# Patient Record
Sex: Female | Born: 1955 | Race: White | Hispanic: No | Marital: Married | Smoking: Never smoker
Health system: Southern US, Community
[De-identification: ages and names within clinical notes are randomized; demographics above are authoritative.]

## PROBLEM LIST (undated history)

## (undated) DIAGNOSIS — C189 Malignant neoplasm of colon, unspecified: Secondary | ICD-10-CM

## (undated) DIAGNOSIS — K219 Gastro-esophageal reflux disease without esophagitis: Secondary | ICD-10-CM

## (undated) DIAGNOSIS — I10 Essential (primary) hypertension: Secondary | ICD-10-CM

## (undated) DIAGNOSIS — B351 Tinea unguium: Secondary | ICD-10-CM

## (undated) DIAGNOSIS — H532 Diplopia: Secondary | ICD-10-CM

## (undated) DIAGNOSIS — R1312 Dysphagia, oropharyngeal phase: Secondary | ICD-10-CM

## (undated) DIAGNOSIS — E785 Hyperlipidemia, unspecified: Secondary | ICD-10-CM

## (undated) DIAGNOSIS — I639 Cerebral infarction, unspecified: Secondary | ICD-10-CM

## (undated) HISTORY — DX: Essential (primary) hypertension: I10

## (undated) HISTORY — DX: Malignant neoplasm of colon, unspecified: C18.9

## (undated) HISTORY — DX: Diplopia: H53.2

## (undated) HISTORY — DX: Dysphagia, oropharyngeal phase: R13.12

## (undated) HISTORY — DX: Tinea unguium: B35.1

## (undated) HISTORY — PX: PORT A CATH INJECTION (ARMC HX): HXRAD1731

## (undated) HISTORY — PX: COLOSTOMY: SHX63

---

## 2013-09-20 DIAGNOSIS — Z0000 Encounter for general adult medical examination without abnormal findings: Secondary | ICD-10-CM | POA: Insufficient documentation

## 2013-09-20 DIAGNOSIS — K5900 Constipation, unspecified: Secondary | ICD-10-CM | POA: Insufficient documentation

## 2013-09-20 DIAGNOSIS — B351 Tinea unguium: Secondary | ICD-10-CM

## 2013-09-20 DIAGNOSIS — E785 Hyperlipidemia, unspecified: Secondary | ICD-10-CM | POA: Insufficient documentation

## 2013-09-20 DIAGNOSIS — R1312 Dysphagia, oropharyngeal phase: Secondary | ICD-10-CM | POA: Insufficient documentation

## 2013-09-20 DIAGNOSIS — E7439 Other disorders of intestinal carbohydrate absorption: Secondary | ICD-10-CM | POA: Insufficient documentation

## 2013-09-20 DIAGNOSIS — I10 Essential (primary) hypertension: Secondary | ICD-10-CM

## 2013-09-20 DIAGNOSIS — I69959 Hemiplegia and hemiparesis following unspecified cerebrovascular disease affecting unspecified side: Secondary | ICD-10-CM | POA: Insufficient documentation

## 2013-09-20 HISTORY — DX: Dysphagia, oropharyngeal phase: R13.12

## 2013-09-20 HISTORY — DX: Tinea unguium: B35.1

## 2013-09-20 HISTORY — DX: Essential (primary) hypertension: I10

## 2014-07-05 DIAGNOSIS — H532 Diplopia: Secondary | ICD-10-CM

## 2014-07-05 HISTORY — DX: Diplopia: H53.2

## 2016-05-26 DIAGNOSIS — R933 Abnormal findings on diagnostic imaging of other parts of digestive tract: Secondary | ICD-10-CM | POA: Insufficient documentation

## 2016-06-15 DIAGNOSIS — R29818 Other symptoms and signs involving the nervous system: Secondary | ICD-10-CM

## 2016-06-15 DIAGNOSIS — C801 Malignant (primary) neoplasm, unspecified: Secondary | ICD-10-CM | POA: Insufficient documentation

## 2016-06-15 DIAGNOSIS — I69398 Other sequelae of cerebral infarction: Secondary | ICD-10-CM | POA: Insufficient documentation

## 2016-07-17 ENCOUNTER — Emergency Department
Admission: EM | Admit: 2016-07-17 | Discharge: 2016-07-17 | Disposition: A | Discharge status: Home / Self Care | Payer: BLUE CROSS/BLUE SHIELD | Attending: Emergency Medicine | Admitting: Emergency Medicine

## 2016-07-17 ENCOUNTER — Encounter: Payer: Self-pay | Admitting: Emergency Medicine

## 2016-07-17 DIAGNOSIS — Z8673 Personal history of transient ischemic attack (TIA), and cerebral infarction without residual deficits: Secondary | ICD-10-CM | POA: Insufficient documentation

## 2016-07-17 DIAGNOSIS — I10 Essential (primary) hypertension: Secondary | ICD-10-CM | POA: Diagnosis not present

## 2016-07-17 DIAGNOSIS — Z4689 Encounter for fitting and adjustment of other specified devices: Secondary | ICD-10-CM | POA: Insufficient documentation

## 2016-07-17 DIAGNOSIS — Z433 Encounter for attention to colostomy: Secondary | ICD-10-CM | POA: Diagnosis present

## 2016-07-17 HISTORY — DX: Hyperlipidemia, unspecified: E78.5

## 2016-07-17 HISTORY — DX: Gastro-esophageal reflux disease without esophagitis: K21.9

## 2016-07-17 HISTORY — DX: Cerebral infarction, unspecified: I63.9

## 2016-07-17 HISTORY — DX: Essential (primary) hypertension: I10

## 2016-07-17 NOTE — Consult Note (Signed)
[place] Nurse ostomy consult note Stoma type/location: RLQ ileostomy, placed in another facility.  Patient here visiting family.  Is having problems with pouch seal.  Stoma is located in creasing at 3 and 9:00, os is pointed down and flush with skin Stomal assessment/size: 1 ", slightly oval Peristomal assessment: Denuded.  Discussed and demonstrated crusting with powder and skin prep Treatment options for stomal/peristomal skin: Barrier ring flattened to fill in creases at 3 and 9:00.  Barrier ring to promote seal.  Convex barrier used to promote seal to flush stoma Output liquid green effluent Ostomy pouching: 2pc. Convex barrier and pouch.  3 sets provided to patient  Education provided: POuch change performed for patient.  Discussed crusting and need to fill in defects.  Explained rationale for convex system.  Written materials and my contact info is provided to the patient for ongoing assistance while here.  Enrolled patient in [place] program: No  HAs  A DME already  Domenic Moras RN BSN [facility] Pager [phone]

## 2016-07-17 NOTE — ED Notes (Signed)
This RN removed patient's colostomy bag, upon removal of colostomy bag, pt had noted buildup of adhesive on skin around stoma site and skin was noted to be red and irritated. This RN attempted to removed as much adhesive as possible to provide best seal of colostomy bag. This RN instructed patient's husband to stop pulling off colostomy bag due to irritation to skin and to make sure to removal all of the adhesive prior to placing a new bag. Pt's husband states understanding. Pt's husband states understanding of how to place colostomy bag and how to empty at this time.

## 2016-07-17 NOTE — ED Provider Notes (Signed)
[facility] Emergency Department Provider Note       Time seen: ----------------------------------------- 12:47 PM on 07/17/2016 -----------------------------------------     I have reviewed the triage vital signs and the nursing notes.   HISTORY   Chief Complaint Colostomy check    HPI Theresa Brock is a 61 y.o. female who presents to the ED for problems with colostomy bag. Patient drove from [place] to here to see the grandchildren. Patient had have emergency: Surgery when she got here and had a colostomy placed. She has not established care with a doctor here and will be traveling back to [place] soon. Patient has been unable to find help with a colostomy bag and it has been leaking. Patient denies pain or medical complaints   Past Medical History:  Diagnosis Date  . GERD (gastroesophageal reflux disease)   . Hyperlipidemia   . Hypertension   . Stroke [facility])     There are no active problems to display for this patient.   Past Surgical History:  Procedure Laterality Date  . COLOSTOMY      Allergies Patient has no known allergies.  Social History Social History  Substance Use Topics  . Smoking status: Never Smoker  . Smokeless tobacco: Never Used  . Alcohol use No    Review of Systems Constitutional: Negative for fever. Cardiovascular: Negative for chest pain. Respiratory: Negative for shortness of breath. Gastrointestinal: Negative for abdominal pain, vomiting and diarrhea. Skin: Positive for drainage around the colostomy site Neurological: Negative for headaches, focal weakness or numbness.  All systems negative/normal/unremarkable except as stated in the HPI  ____________________________________________   PHYSICAL EXAM:  VITAL SIGNS: ED Triage Vitals  Enc Vitals Group     BP 07/17/16 1123 114/88     Pulse Rate 07/17/16 1123 96     Resp 07/17/16 1123 18     Temp 07/17/16 1123 98.4 F (36.9 C)     Temp Source 07/17/16  1123 Oral     SpO2 07/17/16 1123 98 %     Weight 07/17/16 1124 140 lb (63.5 kg)     Height 07/17/16 1124 5\' 1"  (1.549 m)     Head Circumference --      Peak Flow --      Pain Score --      Pain Loc --      Pain Edu? --      Excl. in [place]? --     Constitutional: Alert and oriented. Well appearing and in no distress. Eyes: Conjunctivae are normal. Normal extraocular movements. Gastrointestinal: Soft and nontender. Normal bowel sounds. Colostomy site appears clean dry and intact with no signs of infection Musculoskeletal: Nontender with normal range of motion in extremities. No lower extremity tenderness nor edema. Neurologic:  Normal speech and language. Left-sided weakness and partial paralysis is noted Skin:  Skin is warm, dry and intact. No rash noted. ____________________________________________  ED COURSE:  Pertinent labs & imaging results that were available during my care of the patient were reviewed by me and considered in my medical decision making (see chart for details). Patient presents for drainage from the colostomy site, we will assess with labs and imaging as indicated.   Procedures ___________________________________________  FINAL ASSESSMENT AND PLAN  Drainage from colostomy  Plan: Patient had presented for drainage from her colostomy and difficulty changing the colostomy bag. We have changed her to a different bag and this appears to be working better. She is stable for discharge at this time.  Earleen Newport, MD   Note: This note was generated in part or whole with voice recognition software. Voice recognition is usually quite accurate but there are transcription errors that can and very often do occur. I apologize for any typographical errors that were not detected and corrected.     Earleen Newport, MD 07/17/16 1250

## 2016-07-17 NOTE — ED Triage Notes (Signed)
Pt comes into the ED via POV c/o colostomy bag leakage.  Patient traveled from [place] to here to see grandchildren.  Had to have emergency colon surgery when she got here and a colostomy was placed.  Patient has not been established with PCP down here due to them traveling back to [place] soon.  Patient unable to find help with her colostomy bag.  Patient has been having leakage around the bag for a couple of weeks.  Patient denies any abdominal pain, N/V.  Patient in NAD at this time with even and unlabored respirations.

## 2017-01-22 ENCOUNTER — Other Ambulatory Visit: Payer: Self-pay | Admitting: *Deleted

## 2017-01-23 ENCOUNTER — Encounter: Payer: Self-pay | Admitting: Oncology

## 2017-01-23 ENCOUNTER — Inpatient Hospital Stay: Payer: BLUE CROSS/BLUE SHIELD | Attending: Oncology | Admitting: Oncology

## 2017-01-23 ENCOUNTER — Telehealth: Payer: Self-pay | Admitting: *Deleted

## 2017-01-23 ENCOUNTER — Other Ambulatory Visit: Payer: Self-pay | Admitting: *Deleted

## 2017-01-23 ENCOUNTER — Other Ambulatory Visit: Payer: Self-pay

## 2017-01-23 ENCOUNTER — Inpatient Hospital Stay: Payer: BLUE CROSS/BLUE SHIELD

## 2017-01-23 ENCOUNTER — Encounter (INDEPENDENT_AMBULATORY_CARE_PROVIDER_SITE_OTHER): Payer: Self-pay

## 2017-01-23 VITALS — BP 140/90 | HR 81 | Temp 98.7°F | Resp 20 | Ht 61.0 in | Wt 140.0 lb

## 2017-01-23 DIAGNOSIS — Z933 Colostomy status: Secondary | ICD-10-CM | POA: Diagnosis not present

## 2017-01-23 DIAGNOSIS — I10 Essential (primary) hypertension: Secondary | ICD-10-CM | POA: Diagnosis not present

## 2017-01-23 DIAGNOSIS — E785 Hyperlipidemia, unspecified: Secondary | ICD-10-CM | POA: Insufficient documentation

## 2017-01-23 DIAGNOSIS — C189 Malignant neoplasm of colon, unspecified: Secondary | ICD-10-CM

## 2017-01-23 DIAGNOSIS — C182 Malignant neoplasm of ascending colon: Secondary | ICD-10-CM | POA: Diagnosis present

## 2017-01-23 DIAGNOSIS — R9389 Abnormal findings on diagnostic imaging of other specified body structures: Secondary | ICD-10-CM | POA: Insufficient documentation

## 2017-01-23 DIAGNOSIS — Z79899 Other long term (current) drug therapy: Secondary | ICD-10-CM | POA: Insufficient documentation

## 2017-01-23 DIAGNOSIS — Z95828 Presence of other vascular implants and grafts: Secondary | ICD-10-CM

## 2017-01-23 DIAGNOSIS — K219 Gastro-esophageal reflux disease without esophagitis: Secondary | ICD-10-CM | POA: Diagnosis not present

## 2017-01-23 DIAGNOSIS — I69359 Hemiplegia and hemiparesis following cerebral infarction affecting unspecified side: Secondary | ICD-10-CM | POA: Diagnosis not present

## 2017-01-23 DIAGNOSIS — Z9221 Personal history of antineoplastic chemotherapy: Secondary | ICD-10-CM

## 2017-01-23 DIAGNOSIS — Z85038 Personal history of other malignant neoplasm of large intestine: Secondary | ICD-10-CM

## 2017-01-23 DIAGNOSIS — R627 Adult failure to thrive: Secondary | ICD-10-CM | POA: Insufficient documentation

## 2017-01-23 DIAGNOSIS — Z08 Encounter for follow-up examination after completed treatment for malignant neoplasm: Secondary | ICD-10-CM | POA: Insufficient documentation

## 2017-01-23 HISTORY — DX: Malignant neoplasm of colon, unspecified: C18.9

## 2017-01-23 LAB — CBC WITH DIFFERENTIAL/PLATELET
BASOS PCT: 1 %
Basophils Absolute: 0.1 10*3/uL (ref 0–0.1)
EOS ABS: 0.1 10*3/uL (ref 0–0.7)
EOS PCT: 1 %
HCT: 34.7 % — ABNORMAL LOW (ref 35.0–47.0)
HEMOGLOBIN: 11.7 g/dL — AB (ref 12.0–16.0)
Lymphocytes Relative: 21 %
Lymphs Abs: 2 10*3/uL (ref 1.0–3.6)
MCH: 32.9 pg (ref 26.0–34.0)
MCHC: 33.6 g/dL (ref 32.0–36.0)
MCV: 97.7 fL (ref 80.0–100.0)
MONO ABS: 0.7 10*3/uL (ref 0.2–0.9)
MONOS PCT: 8 %
NEUTROS PCT: 69 %
Neutro Abs: 6.6 10*3/uL — ABNORMAL HIGH (ref 1.4–6.5)
PLATELETS: 332 10*3/uL (ref 150–440)
RBC: 3.55 MIL/uL — ABNORMAL LOW (ref 3.80–5.20)
RDW: 16.2 % — AB (ref 11.5–14.5)
WBC: 9.5 10*3/uL (ref 3.6–11.0)

## 2017-01-23 LAB — COMPREHENSIVE METABOLIC PANEL
ALK PHOS: 172 U/L — AB (ref 38–126)
ALT: 24 U/L (ref 14–54)
AST: 24 U/L (ref 15–41)
Albumin: 3.7 g/dL (ref 3.5–5.0)
Anion gap: 6 (ref 5–15)
BUN: 15 mg/dL (ref 6–20)
CALCIUM: 9.4 mg/dL (ref 8.9–10.3)
CO2: 25 mmol/L (ref 22–32)
CREATININE: 0.43 mg/dL — AB (ref 0.44–1.00)
Chloride: 105 mmol/L (ref 101–111)
GFR calc non Af Amer: >60 mL/min (ref >60)
Glucose, Bld: 116 mg/dL — ABNORMAL HIGH (ref 65–99)
Potassium: 3.8 mmol/L (ref 3.5–5.1)
SODIUM: 136 mmol/L (ref 135–145)
Total Bilirubin: 0.5 mg/dL (ref 0.3–1.2)
Total Protein: 7.2 g/dL (ref 6.5–8.1)

## 2017-01-23 LAB — IRON AND TIBC
Iron: 57 ug/dL (ref 28–170)
SATURATION RATIOS: 16 % (ref 10.4–31.8)
TIBC: 362 ug/dL (ref 250–450)
UIBC: 305 ug/dL

## 2017-01-23 LAB — VITAMIN B12: VITAMIN B 12: 295 pg/mL (ref 180–914)

## 2017-01-23 LAB — FOLATE: Folate: 32 ng/mL (ref >5.9)

## 2017-01-23 LAB — FERRITIN: Ferritin: 37 ng/mL (ref 11–307)

## 2017-01-23 MED ORDER — HEPARIN SOD (PORK) LOCK FLUSH 100 UNIT/ML IV SOLN
500.0000 [IU] | INTRAVENOUS | Status: AC | PRN
  Administered 2017-01-23: 500 [IU]

## 2017-01-23 MED ORDER — SODIUM CHLORIDE 0.9% FLUSH
10.0000 mL | INTRAVENOUS | Status: AC | PRN
  Administered 2017-01-23: 10 mL
  Filled 2017-01-23: qty 10

## 2017-01-23 NOTE — Progress Notes (Signed)
Hematology/Oncology Consult note [facility] Telephone:([phone] [phone]  Patient Care Team: Patient, No Pcp Per as PCP - General (General Practice)   Name of the patient: Theresa Brock  [id number]  September 07, 1955    Reason for referral- h/o colon cancer   Referring physician- Dr. 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   She had presented with abdominal pain nausea vomiting and intermittent rectal bleeding in April 2018 and was found to have small bowel obstruction at that time.   Patient had a total colectomy on 06/04/2016 pathology showed invasive adenocarcinoma ring-type involving the cecum and the ascending colon.  Tubulovillous adenoma with high-grade dysplasia in the ascending colon.  2 small tubular adenomas in the ascending colon.  One inflammatory polyp in the ascending colon.  Serrated adenoma in the transverse colon.  8 out of 19 pericolonic lymph nodes were positive for tumor.  No loss of nuclear expression of MMR proteins.  Tumor was poorly differentiated and invades through the muscularis propria into pericolonic tissue.  No microscopy perforation.  L VIN PNI were present.  Margins were negative.  pT3 pN2b  Patient had a complicated postoperative course following the colectomy with postoperative ileus C. difficile colitis and was admitted in the ICU for septic shock with pressors.  There was a considerable delay because of that in starting adjuvant chemotherapy.  CBC back in July 2018 was normal with a white count of 10.7, H&H of 12/37 and a platelet count of 445.  LFTs were within normal limits and  serum creatinine was 0.6.  CT chest abdomen and pelvis in July 2018 did not reveal any evidence of recurrent or metastatic disease.  Patient was also seen by GYN for thickened endometrium and underwent transvaginal ultrasound in August 2018 which showed a 2.8 cm fibroid as well as endometrial thickening of 12.8 mm.  Plan was for hysteroscopy and D&C in September 2018  Adjuvant chemotherapy with FOLFOX was planned every 2 weeks for 12 cycles which started in August 2018.  Adjuvant chemotherapy was initiated on 09/30/2016 and from review of records so far I see that her last chemotherapy was cycle #5 that was given on 11/27/2016 after that chemo patient was admitted to the hospital on 12/07/2016 with renal failure and failure to thrive.  Patient did not desire further chemo at that point  Patient has [place] from [place] to be with her daughter and son-in-law but is not currently sure how long she will stay here.  She has lived in an [place] for the last 20 years.  Currently patient feels back to her baseline after she stopped her chemotherapy in October 2018.  She does not report any peripheral neuropathy from chemotherapy.  Reports that her appetite is good and she is slowly gaining weight.  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 Negative for chills, fever and weight loss.  HENT: Negative for congestion, ear discharge and nosebleeds.   Eyes: Negative for blurred  vision.  Respiratory: Negative for cough, hemoptysis, sputum production, shortness of breath and wheezing.   Cardiovascular: Negative for chest pain, palpitations, orthopnea and claudication.  Gastrointestinal: Negative for abdominal pain, blood in stool, constipation, diarrhea, heartburn, melena, nausea and vomiting.  Genitourinary:  Negative for dysuria, flank pain, frequency, hematuria and urgency.  Musculoskeletal: Negative for back pain, joint pain and myalgias.  Skin: Negative for rash.  Neurological: Positive for focal weakness. Negative for dizziness, tingling, seizures, weakness and headaches.  Endo/Heme/Allergies: Does not bruise/bleed easily.  Psychiatric/Behavioral: Negative for depression and suicidal ideas. The patient does not have insomnia.     No Known Allergies  There are no active problems to display for this patient.    Past Medical History:  Diagnosis Date  . Colon cancer ([place])   . GERD (gastroesophageal reflux disease)   . Hyperlipidemia   . Hypertension   . Stroke [facility])      Past Surgical History:  Procedure Laterality Date  . COLOSTOMY      Social History   Socioeconomic History  . Marital status: Married    Spouse name: Not on file  . Number of children: Not on file  . Years of education: Not on file  . Highest education level: Not on file  Social Needs  . Financial resource strain: Not on file  . Food insecurity - worry: Not on file  . Food insecurity - inability: Not on file  . Transportation needs - medical: Not on file  . Transportation needs - non-medical: Not on file  Occupational History  . Not on file  Tobacco Use  . Smoking status: Never Smoker  . Smokeless tobacco: Never Used  Substance and Sexual Activity  . Alcohol use: No  . Drug use: No  . Sexual activity: Not on file  Other Topics Concern  . Not on file  Social History Narrative  . Not on file     No family history on file.   Current Outpatient Medications:  .  atorvastatin (LIPITOR) 20 MG tablet, , Disp: , Rfl: 0 .  losartan (COZAAR) 25 MG tablet, , Disp: , Rfl: 0 .  Multiple Vitamin (MULTIVITAMIN) tablet, Take 1 tablet by mouth daily., Disp: , Rfl:  .  Potassium 99 MG TABS, Take by mouth., Disp: , Rfl:    Physical exam:  Vitals:   01/23/17 0935 01/23/17 0944  BP:  140/90  Pulse:  81    Resp:  20  Temp:  98.7 F (37.1 C)  TempSrc:  Tympanic  Weight: 140 lb (63.5 kg)   Height: 5' 1"  (1.549 m)    Physical Exam  Constitutional: She is oriented to person, place, and time.  Obese female sitting in a wheelchair.  Appears in no acute distress.  HENT:  Head: Normocephalic and atraumatic.  Eyes: EOM are normal. Pupils are equal, round, and reactive to light.  Neck: Normal range of motion.  Cardiovascular: Normal rate, regular rhythm and normal heart sounds.  Pulmonary/Chest: Effort normal and breath sounds normal.  Chest wall port in place  Abdominal: Soft. Bowel sounds are normal.  Ileostomy in place  Neurological: She is alert and oriented to person, place, and time.  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  Right facial droop.  Skin: Skin is warm and dry.      Assessment and plan- Patient is a 61 y.o. female with a history of stage III colon cancer pT3 pN2 cM0 status post total colectomy and 5 cycles of adjuvant chemotherapy  with FOLFOX ending in October 2018.  Patient did not desire further chemotherapy due to poor tolerance and comorbidities  Today I will check a CBC with differential, CMP, CEA, ferritin and iron studies as well as B12 and folate.  Patient's last CT scan was in April 2018 and I will obtain repeat CT chest abdomen and pelvis (with or without contrast depending on creatinine).  I will call her with the results of the CT scan but if there are any concerning findings I will see her sooner  I will see her back in 3 months time with blood work  She will need port flushes every 8 weeks  Cancer Staging Malignant neoplasm of colon [facility]) Staging form: Colon and Rectum, AJCC 8th Edition - Pathologic stage from 01/23/2017: Stage IIIC (pT3, pN2b, cM0) - Signed by Sindy Guadeloupe, MD on 01/23/2017   Greater than 30 minutes of time spent in reviewing outside records.   Thank you for this kind referral and the opportunity to participate in the care of  this patient   Visit Diagnosis 1. Malignant neoplasm of colon, unspecified part of colon ([place])   2. Encounter for follow-up surveillance of colon cancer     Dr. Randa Evens, MD, MPH [facility] at [facility] Pager- [phone] 01/23/2017  10:10 AM

## 2017-01-23 NOTE — Progress Notes (Deleted)
     Hematology/Oncology Consult note [facility]  Telephone:([phone] [phone]  Patient Care Team: Patient, No Pcp Per as PCP - General (General Practice)   Name of the patient: Theresa Brock  [id number]  1955/12/10   Date of visit: 01/23/17  Diagnosis-stage IIIc colon cancer  Chief complaint/ Reason for visit- ***  Heme/Onc history: ***  Interval history- ***  ECOG PS- *** Pain scale- *** Opioid associated constipation- ***  Review of systems- Review of Systems  Constitutional: Negative for chills, fever, malaise/fatigue and weight loss.  HENT: Negative for congestion, ear discharge and nosebleeds.   Eyes: Negative for blurred vision.  Respiratory: Negative for cough, hemoptysis, sputum production, shortness of breath and wheezing.   Cardiovascular: Negative for chest pain, palpitations, orthopnea and claudication.  Gastrointestinal: Negative for abdominal pain, blood in stool, constipation, diarrhea, heartburn, melena, nausea and vomiting.  Genitourinary: Negative for dysuria, flank pain, frequency, hematuria and urgency.  Musculoskeletal: Negative for back pain, joint pain and myalgias.  Skin: Negative for rash.  Neurological: Negative for dizziness, tingling, focal weakness, seizures, weakness and headaches.  Endo/Heme/Allergies: Does not bruise/bleed easily.  Psychiatric/Behavioral: Negative for depression and suicidal ideas. The patient does not have insomnia.       No Known Allergies   Past Medical History:  Diagnosis Date  . GERD (gastroesophageal reflux disease)   . Hyperlipidemia   . Hypertension   . Stroke [facility])      Past Surgical History:  Procedure Laterality Date  . COLOSTOMY      Social History   Socioeconomic History  . Marital status: Married    Spouse name: Not on file  . Number of children: Not on file  . Years of education: Not on file  . Highest education level: Not on file  Social Needs  .  Financial resource strain: Not on file  . Food insecurity - worry: Not on file  . Food insecurity - inability: Not on file  . Transportation needs - medical: Not on file  . Transportation needs - non-medical: Not on file  Occupational History  . Not on file  Tobacco Use  . Smoking status: Never Smoker  . Smokeless tobacco: Never Used  Substance and Sexual Activity  . Alcohol use: No  . Drug use: No  . Sexual activity: Not on file  Other Topics Concern  . Not on file  Social History Narrative  . Not on file    No family history on file.  No current outpatient medications on file.  Physical exam: There were no vitals filed for this visit. Physical Exam  Constitutional: She is oriented to person, place, and time and well-developed, well-nourished, and in no distress.  HENT:  Head: Normocephalic and atraumatic.  Eyes: EOM are normal. Pupils are equal, round, and reactive to light.  Neck: Normal range of motion.  Cardiovascular: Normal rate, regular rhythm and normal heart sounds.  Pulmonary/Chest: Effort normal and breath sounds normal.  Abdominal: Soft. Bowel sounds are normal.  Neurological: She is alert and oriented to person, place, and time.  Skin: Skin is warm and dry.       Assessment and plan- Patient is a 61 y.o. female ***   Visit Diagnosis No diagnosis found.   Dr. Randa Evens, MD, MPH [place] at [facility] Pager- [phone] 01/23/2017 9:17 AM

## 2017-01-23 NOTE — Telephone Encounter (Signed)
Called pt and left message on phone that her kidney function was good and so therefore Dr. Janese Banks will order the CT scan and the scheduler here will call her with date and times and if she has any questions she can call me and left my number.

## 2017-01-24 LAB — CEA: CEA: 1.4 ng/mL (ref 0.0–4.7)

## 2017-03-18 ENCOUNTER — Telehealth: Payer: Self-pay | Admitting: *Deleted

## 2017-03-18 ENCOUNTER — Other Ambulatory Visit: Payer: Self-pay | Admitting: *Deleted

## 2017-03-18 DIAGNOSIS — C189 Malignant neoplasm of colon, unspecified: Secondary | ICD-10-CM

## 2017-03-18 NOTE — Telephone Encounter (Signed)
Called and got in touch with husband and pt has been scheduled for ct scan for 2/22 and arrive at [facility] radiology on [address] at 1:15. The scan is 1:30.  NPO for 4 hours prior to scan.  Patient has port flush on 2/21 and we will draw a lba on her to make sure her kidney function is ok.  Also when they get the port flush then they will need to go to [address] and pick up prep kit. Husband called out the address to the outpt [address] [facility] and the dates and times for scan and instructions and he will call me back if he has issues

## 2017-03-23 ENCOUNTER — Other Ambulatory Visit: Payer: Self-pay | Admitting: Oncology

## 2017-03-23 ENCOUNTER — Other Ambulatory Visit: Payer: Self-pay | Admitting: *Deleted

## 2017-03-26 ENCOUNTER — Inpatient Hospital Stay: Payer: BLUE CROSS/BLUE SHIELD | Attending: Oncology

## 2017-03-26 ENCOUNTER — Telehealth: Payer: Self-pay | Admitting: *Deleted

## 2017-03-26 DIAGNOSIS — K219 Gastro-esophageal reflux disease without esophagitis: Secondary | ICD-10-CM | POA: Diagnosis not present

## 2017-03-26 DIAGNOSIS — Z79899 Other long term (current) drug therapy: Secondary | ICD-10-CM | POA: Diagnosis not present

## 2017-03-26 DIAGNOSIS — Z933 Colostomy status: Secondary | ICD-10-CM | POA: Insufficient documentation

## 2017-03-26 DIAGNOSIS — Z9221 Personal history of antineoplastic chemotherapy: Secondary | ICD-10-CM | POA: Diagnosis not present

## 2017-03-26 DIAGNOSIS — I69359 Hemiplegia and hemiparesis following cerebral infarction affecting unspecified side: Secondary | ICD-10-CM | POA: Insufficient documentation

## 2017-03-26 DIAGNOSIS — C189 Malignant neoplasm of colon, unspecified: Secondary | ICD-10-CM

## 2017-03-26 DIAGNOSIS — C182 Malignant neoplasm of ascending colon: Secondary | ICD-10-CM | POA: Diagnosis present

## 2017-03-26 DIAGNOSIS — I10 Essential (primary) hypertension: Secondary | ICD-10-CM | POA: Insufficient documentation

## 2017-03-26 DIAGNOSIS — E785 Hyperlipidemia, unspecified: Secondary | ICD-10-CM | POA: Diagnosis not present

## 2017-03-26 DIAGNOSIS — R9389 Abnormal findings on diagnostic imaging of other specified body structures: Secondary | ICD-10-CM | POA: Insufficient documentation

## 2017-03-26 DIAGNOSIS — R627 Adult failure to thrive: Secondary | ICD-10-CM | POA: Insufficient documentation

## 2017-03-26 DIAGNOSIS — Z95828 Presence of other vascular implants and grafts: Secondary | ICD-10-CM

## 2017-03-26 LAB — BASIC METABOLIC PANEL
Anion gap: 5 (ref 5–15)
BUN: 18 mg/dL (ref 6–20)
CALCIUM: 9.5 mg/dL (ref 8.9–10.3)
CO2: 27 mmol/L (ref 22–32)
CREATININE: 0.58 mg/dL (ref 0.44–1.00)
Chloride: 106 mmol/L (ref 101–111)
GFR calc Af Amer: >60 mL/min (ref >60)
GFR calc non Af Amer: >60 mL/min (ref >60)
GLUCOSE: 117 mg/dL — AB (ref 65–99)
Potassium: 4 mmol/L (ref 3.5–5.1)
Sodium: 138 mmol/L (ref 135–145)

## 2017-03-26 MED ORDER — SODIUM CHLORIDE 0.9% FLUSH
10.0000 mL | INTRAVENOUS | Status: AC | PRN
  Administered 2017-03-26: 10 mL
  Filled 2017-03-26: qty 10

## 2017-03-26 MED ORDER — HEPARIN SOD (PORK) LOCK FLUSH 100 UNIT/ML IV SOLN
500.0000 [IU] | INTRAVENOUS | Status: AC | PRN
  Administered 2017-03-26: 500 [IU]

## 2017-03-26 NOTE — Telephone Encounter (Signed)
Called and left message with husband that the dept. That gets authorization said that her insurance did not require PA.  Then when it went to next level there was another number they needed to call and when they did it required prior auth. thye have sent in the info but not got authorization.  The ct scan has been changed to 3/6. Husband called me back and understands and I told him that when I see that it has been approved I will call him back. He is agreeable to the plan

## 2017-03-27 ENCOUNTER — Ambulatory Visit: Admission: RE | Admit: 2017-03-27 | Payer: BLUE CROSS/BLUE SHIELD | Source: Ambulatory Visit

## 2017-03-30 ENCOUNTER — Telehealth: Payer: Self-pay | Admitting: *Deleted

## 2017-03-30 NOTE — Telephone Encounter (Signed)
Called patient to inform her that her CT Scan was approved by insurance. Gave message to patient's husband.      dhs

## 2017-04-08 ENCOUNTER — Ambulatory Visit
Admission: RE | Admit: 2017-04-08 | Discharge: 2017-04-08 | Disposition: A | Discharge status: Home / Self Care | Payer: BLUE CROSS/BLUE SHIELD | Source: Ambulatory Visit | Attending: Oncology | Admitting: Oncology

## 2017-04-08 DIAGNOSIS — Z85038 Personal history of other malignant neoplasm of large intestine: Secondary | ICD-10-CM | POA: Insufficient documentation

## 2017-04-08 DIAGNOSIS — C189 Malignant neoplasm of colon, unspecified: Secondary | ICD-10-CM

## 2017-04-08 DIAGNOSIS — D259 Leiomyoma of uterus, unspecified: Secondary | ICD-10-CM | POA: Diagnosis not present

## 2017-04-08 DIAGNOSIS — K8020 Calculus of gallbladder without cholecystitis without obstruction: Secondary | ICD-10-CM | POA: Insufficient documentation

## 2017-04-08 DIAGNOSIS — K760 Fatty (change of) liver, not elsewhere classified: Secondary | ICD-10-CM | POA: Diagnosis not present

## 2017-04-08 MED ORDER — IOPAMIDOL (ISOVUE-300) INJECTION 61%
100.0000 mL | Freq: Once | INTRAVENOUS | Status: AC | PRN
  Administered 2017-04-08: 100 mL via INTRAVENOUS

## 2017-04-21 ENCOUNTER — Telehealth: Payer: Self-pay | Admitting: *Deleted

## 2017-04-21 NOTE — Telephone Encounter (Signed)
Husband called to get results of ct scan. Called him back and left message that the scan did not show any recurrent disease. The patient has appt 3/21

## 2017-04-23 ENCOUNTER — Encounter: Payer: Self-pay | Admitting: Oncology

## 2017-04-23 ENCOUNTER — Telehealth: Payer: Self-pay

## 2017-04-23 ENCOUNTER — Inpatient Hospital Stay (HOSPITAL_BASED_OUTPATIENT_CLINIC_OR_DEPARTMENT_OTHER): Payer: BLUE CROSS/BLUE SHIELD | Admitting: Oncology

## 2017-04-23 ENCOUNTER — Inpatient Hospital Stay: Payer: BLUE CROSS/BLUE SHIELD | Attending: Oncology

## 2017-04-23 VITALS — BP 128/96 | HR 93 | Temp 98.9°F | Resp 18 | Ht 61.0 in | Wt 146.8 lb

## 2017-04-23 DIAGNOSIS — Z79899 Other long term (current) drug therapy: Secondary | ICD-10-CM | POA: Diagnosis not present

## 2017-04-23 DIAGNOSIS — C189 Malignant neoplasm of colon, unspecified: Secondary | ICD-10-CM

## 2017-04-23 DIAGNOSIS — Z9221 Personal history of antineoplastic chemotherapy: Secondary | ICD-10-CM

## 2017-04-23 DIAGNOSIS — Z85038 Personal history of other malignant neoplasm of large intestine: Secondary | ICD-10-CM | POA: Insufficient documentation

## 2017-04-23 DIAGNOSIS — E785 Hyperlipidemia, unspecified: Secondary | ICD-10-CM | POA: Diagnosis not present

## 2017-04-23 DIAGNOSIS — K760 Fatty (change of) liver, not elsewhere classified: Secondary | ICD-10-CM | POA: Insufficient documentation

## 2017-04-23 DIAGNOSIS — K219 Gastro-esophageal reflux disease without esophagitis: Secondary | ICD-10-CM | POA: Diagnosis not present

## 2017-04-23 DIAGNOSIS — Z08 Encounter for follow-up examination after completed treatment for malignant neoplasm: Secondary | ICD-10-CM

## 2017-04-23 DIAGNOSIS — K8020 Calculus of gallbladder without cholecystitis without obstruction: Secondary | ICD-10-CM | POA: Insufficient documentation

## 2017-04-23 DIAGNOSIS — Z8673 Personal history of transient ischemic attack (TIA), and cerebral infarction without residual deficits: Secondary | ICD-10-CM | POA: Diagnosis not present

## 2017-04-23 DIAGNOSIS — I10 Essential (primary) hypertension: Secondary | ICD-10-CM | POA: Insufficient documentation

## 2017-04-23 LAB — COMPREHENSIVE METABOLIC PANEL
ALBUMIN: 4 g/dL (ref 3.5–5.0)
ALK PHOS: 147 U/L — AB (ref 38–126)
ALT: 34 U/L (ref 14–54)
AST: 22 U/L (ref 15–41)
Anion gap: 9 (ref 5–15)
BILIRUBIN TOTAL: 0.3 mg/dL (ref 0.3–1.2)
BUN: 21 mg/dL — ABNORMAL HIGH (ref 6–20)
CALCIUM: 9.4 mg/dL (ref 8.9–10.3)
CO2: 23 mmol/L (ref 22–32)
Chloride: 105 mmol/L (ref 101–111)
Creatinine, Ser: 0.67 mg/dL (ref 0.44–1.00)
GFR calc Af Amer: >60 mL/min (ref >60)
GFR calc non Af Amer: >60 mL/min (ref >60)
Glucose, Bld: 133 mg/dL — ABNORMAL HIGH (ref 65–99)
Potassium: 4 mmol/L (ref 3.5–5.1)
Sodium: 137 mmol/L (ref 135–145)
TOTAL PROTEIN: 7.6 g/dL (ref 6.5–8.1)

## 2017-04-23 LAB — CBC WITH DIFFERENTIAL/PLATELET
BASOS ABS: 0.1 10*3/uL (ref 0–0.1)
BASOS PCT: 1 %
EOS PCT: 1 %
Eosinophils Absolute: 0.1 10*3/uL (ref 0–0.7)
HEMATOCRIT: 38.6 % (ref 35.0–47.0)
Hemoglobin: 13.3 g/dL (ref 12.0–16.0)
Lymphocytes Relative: 22 %
Lymphs Abs: 2 10*3/uL (ref 1.0–3.6)
MCH: 33 pg (ref 26.0–34.0)
MCHC: 34.5 g/dL (ref 32.0–36.0)
MCV: 95.6 fL (ref 80.0–100.0)
MONO ABS: 0.6 10*3/uL (ref 0.2–0.9)
Monocytes Relative: 7 %
NEUTROS ABS: 6.1 10*3/uL (ref 1.4–6.5)
Neutrophils Relative %: 69 %
PLATELETS: 327 10*3/uL (ref 150–440)
RBC: 4.03 MIL/uL (ref 3.80–5.20)
RDW: 13.1 % (ref 11.5–14.5)
WBC: 8.8 10*3/uL (ref 3.6–11.0)

## 2017-04-23 NOTE — Progress Notes (Signed)
No new changes noted today 

## 2017-04-23 NOTE — Telephone Encounter (Signed)
Spoke with patient husband about appointment schedule for general surgery for Ileostomy reversal and port removal ( appointment time 04/27/17 at 9:30AM) The patient husband was agreeable and understanding.

## 2017-04-24 LAB — CEA: CEA1: 1.3 ng/mL (ref 0.0–4.7)

## 2017-04-24 NOTE — Progress Notes (Signed)
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   She had presented with abdominal pain nausea vomiting and intermittent rectal bleeding in April 2018 and was found to have small bowel obstruction at that time.   Patient had a total colectomy on 06/04/2016 pathology showed invasive adenocarcinoma ring-type involving the cecum and the ascending colon.  Tubulovillous adenoma with high-grade dysplasia in the ascending colon.  2 small tubular adenomas in the ascending colon.  One inflammatory polyp in the ascending colon.  Serrated adenoma in the transverse colon.  8 out of 19 pericolonic lymph nodes were positive for tumor.  No loss of nuclear expression of MMR proteins.  Tumor was poorly differentiated and invades through the muscularis propria into pericolonic tissue.  No microscopy perforation.  L VIN PNI were present.  Margins were negative.  pT3 pN2b  Patient had a complicated postoperative course following the colectomy with postoperative ileus C. difficile colitis and was admitted in the ICU for septic shock with pressors.  There was a considerable delay because of that in starting adjuvant chemotherapy.  CBC  back in July 2018 was normal with a white count of 10.7, H&H of 12/37 and a platelet count of 445.  LFTs were within normal limits and serum creatinine was 0.6.  CT chest abdomen and pelvis in July 2018 did not reveal any evidence of recurrent or metastatic disease.  Patient was also seen by GYN for thickened endometrium and underwent transvaginal ultrasound in August 2018 which showed a 2.8 cm fibroid as well as endometrial thickening of 12.8 mm.  Plan was for hysteroscopy and D&C in September 2018  Adjuvant chemotherapy with FOLFOX was planned every 2 weeks for 12 cycles which started in August 2018.  Adjuvant chemotherapy was initiated on 09/30/2016 and from review of records so far I see that her last chemotherapy was cycle #5 that was given on 11/27/2016 after that chemo patient was admitted to the hospital on 12/07/2016 with renal failure and failure to thrive.  Patient did not desire further chemo at that point  Patient has [place] from [place] to be with her daughter and son-in-law but is not currently sure how long she will stay here.  She has lived in an [place] for the last 20 years.  Currently patient feels back to her baseline after she stopped her chemotherapy in October 2018.  She does not report any peripheral neuropathy from chemotherapy.  Reports that her appetite is good and she is slowly gaining weight.  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    Interval history- overall she is doing well and feels at her baseline. 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  HENT: Negative for congestion, ear discharge and nosebleeds.   Eyes: Negative for blurred vision.  Respiratory: Negative for cough, hemoptysis, sputum production, shortness of breath and wheezing.   Cardiovascular: Negative for chest  pain, palpitations, orthopnea and claudication.  Gastrointestinal: Negative for abdominal pain, blood in stool, constipation, diarrhea, heartburn, melena, nausea and vomiting.  Genitourinary: Negative for dysuria, flank pain, frequency, hematuria and urgency.  Musculoskeletal: Negative for back pain, joint pain and myalgias.  Skin: Negative for rash.  Neurological: Negative for dizziness, tingling, focal weakness, seizures, weakness and headaches.       Left sided hemiplegia  Endo/Heme/Allergies: Does not bruise/bleed easily.  Psychiatric/Behavioral: Negative for depression and suicidal ideas. The patient does not have insomnia.      No Known Allergies   Past Medical History:  Diagnosis Date  . Colon cancer ([place])   . Diplopia 07/05/2014   Overview:  Converted from Centricity: Description - DIPLOPIA  . Dysphagia, oropharyngeal phase 09/20/2013   Overview:  Converted from Centricity: Description - DYSPHAGIA, OROPHARYNGEAL PHASE  . Essential (primary) hypertension 09/20/2013   Overview:  Converted from Centricity: Description - HYPERTENSION, BENIGN ESSENTIAL  . GERD (gastroesophageal reflux disease)   . Hyperlipidemia   . Hypertension   . Malignant neoplasm of colon ([place]) 01/23/2017  . Stroke ([place])   . Tinea unguium 09/20/2013   Overview:  Converted from Centricity: Description - ONYCHOMYCOSIS, TOENAILS     Past Surgical History:  Procedure Laterality Date  . COLOSTOMY      Social History   Socioeconomic History  . Marital status: Married    Spouse name: Not on file  . Number of children: Not on file  . Years of education: Not on file  . Highest education level: Not on file  Occupational History  . Not on file  Social Needs  . Financial resource strain: Not on file  . Food insecurity:    Worry: Not on file    Inability: Not on file  . Transportation needs:    Medical: Not on file    Non-medical: Not on file  Tobacco Use  . Smoking status: Never Smoker  . Smokeless  tobacco: Never Used  Substance and Sexual Activity  . Alcohol use: No  . Drug use: No  . Sexual activity: Not on file  Lifestyle  . Physical activity:    Days per week: Not on file    Minutes per session: Not on file  . Stress: Not on file  Relationships  . Social connections:    Talks on phone: Not on file    Gets together: Not on file    Attends religious service: Not on file    Active member of club or organization: Not on file    Attends meetings of clubs or organizations: Not on file    Relationship status: Not on file  . Intimate partner violence:    Fear of current or ex partner: Not on file    Emotionally abused: Not on file    Physically abused: Not on file    Forced sexual activity: Not on file  Other Topics Concern  . Not on file  Social History Narrative  . Not on file    Family History  Problem Relation Age of Onset  . Cancer Brother      Current Outpatient Medications:  .  atorvastatin (LIPITOR) 20 MG  tablet, , Disp: , Rfl: 0 .  losartan (COZAAR) 25 MG tablet, , Disp: , Rfl: 0 .  Multiple Vitamin (MULTIVITAMIN) tablet, Take 1 tablet by mouth daily., Disp: , Rfl:  .  Potassium 99 MG TABS, Take by mouth., Disp: , Rfl:   Physical exam:  Vitals:   04/23/17 1115  BP: (!) 128/96  Pulse: 93  Resp: 18  Temp: 98.9 F (37.2 C)  TempSrc: Tympanic  SpO2: 97%  Weight: 146 lb 12.8 oz (66.6 kg)  Height: 5' 1"  (1.549 m)   Physical Exam  Constitutional: She is oriented to person, place, and time.  Mildly obese. Sitting in a wheelchair. Appears in no acute distress  HENT:  Head: Normocephalic and atraumatic.  Eyes: Pupils are equal, round, and reactive to light. EOM are normal.  Neck: Normal range of motion.  Cardiovascular: Normal rate, regular rhythm and normal heart sounds.  Pulmonary/Chest: Effort normal and breath sounds normal.  Abdominal: Soft. Bowel sounds are normal.  Ileostomy in place.   Neurological: She is alert and oriented to person, place,  and time.  Baseline left hemiplegia from prior stroke  Skin: Skin is warm and dry.     CMP Latest Ref Rng & Units 04/23/2017  Glucose 65 - 99 mg/dL 133(H)  BUN 6 - 20 mg/dL 21(H)  Creatinine 0.44 - 1.00 mg/dL 0.67  Sodium 135 - 145 mmol/L 137  Potassium 3.5 - 5.1 mmol/L 4.0  Chloride 101 - 111 mmol/L 105  CO2 22 - 32 mmol/L 23  Calcium 8.9 - 10.3 mg/dL 9.4  Total Protein 6.5 - 8.1 g/dL 7.6  Total Bilirubin 0.3 - 1.2 mg/dL 0.3  Alkaline Phos 38 - 126 U/L 147(H)  AST 15 - 41 U/L 22  ALT 14 - 54 U/L 34   CBC Latest Ref Rng & Units 04/23/2017  WBC 3.6 - 11.0 K/uL 8.8  Hemoglobin 12.0 - 16.0 g/dL 13.3  Hematocrit 35.0 - 47.0 % 38.6  Platelets 150 - 440 K/uL 327    No images are attached to the encounter.  Ct Chest W Contrast  Result Date: 04/09/2017 CLINICAL DATA:  Followup stage III colon carcinoma. Recently completed chemotherapy. Restaging. EXAM: CT CHEST, ABDOMEN, AND PELVIS WITH CONTRAST TECHNIQUE: Multidetector CT imaging of the chest, abdomen and pelvis was performed following the standard protocol during bolus administration of intravenous contrast. CONTRAST:  148m ISOVUE-300 IOPAMIDOL (ISOVUE-300) INJECTION 61% COMPARISON:  None. FINDINGS: CT CHEST FINDINGS Cardiovascular: No acute findings. Mediastinum/Lymph Nodes: No masses or pathologically enlarged lymph nodes identified. Tiny sub-cm bilateral thyroid lobe nodules noted. Lungs/Pleura: No pulmonary infiltrate or mass identified. No effusion present. Musculoskeletal:  No suspicious bone lesions identified. CT ABDOMEN AND PELVIS FINDINGS Hepatobiliary: No masses identified. Mild hepatic steatosis. A few tiny calcified gallstones are seen, however there is no evidence of cholecystitis or biliary ductal dilatation. Pancreas:  No mass or inflammatory changes. Spleen:  Within normal limits in size and appearance. Adrenals/Urinary tract:  No masses or hydronephrosis. Stomach/Bowel: Postop changes from near total colectomy. No evidence of  obstruction, inflammatory process, or abnormal fluid collections. Vascular/Lymphatic: No pathologically enlarged lymph nodes identified. No abdominal aortic aneurysm. Reproductive: 1.5 cm posterior uterine fibroid. Adnexal regions are unremarkable. Other: No evidence of peritoneal thickening, nodularity, or ascites. Musculoskeletal:  No suspicious bone lesions identified. IMPRESSION: No evidence of recurrent or metastatic carcinoma within the chest, abdomen, or pelvis. No other acute findings. Cholelithiasis.  No radiographic evidence of cholecystitis. Hepatic steatosis. Small posterior uterine fibroid. Electronically Signed  By: Earle Gell M.D.   On: 04/09/2017 08:19   Ct Abdomen Pelvis W Contrast  Result Date: 04/09/2017 CLINICAL DATA:  Followup stage III colon carcinoma. Recently completed chemotherapy. Restaging. EXAM: CT CHEST, ABDOMEN, AND PELVIS WITH CONTRAST TECHNIQUE: Multidetector CT imaging of the chest, abdomen and pelvis was performed following the standard protocol during bolus administration of intravenous contrast. CONTRAST:  157m ISOVUE-300 IOPAMIDOL (ISOVUE-300) INJECTION 61% COMPARISON:  None. FINDINGS: CT CHEST FINDINGS Cardiovascular: No acute findings. Mediastinum/Lymph Nodes: No masses or pathologically enlarged lymph nodes identified. Tiny sub-cm bilateral thyroid lobe nodules noted. Lungs/Pleura: No pulmonary infiltrate or mass identified. No effusion present. Musculoskeletal:  No suspicious bone lesions identified. CT ABDOMEN AND PELVIS FINDINGS Hepatobiliary: No masses identified. Mild hepatic steatosis. A few tiny calcified gallstones are seen, however there is no evidence of cholecystitis or biliary ductal dilatation. Pancreas:  No mass or inflammatory changes. Spleen:  Within normal limits in size and appearance. Adrenals/Urinary tract:  No masses or hydronephrosis. Stomach/Bowel: Postop changes from near total colectomy. No evidence of obstruction, inflammatory process, or  abnormal fluid collections. Vascular/Lymphatic: No pathologically enlarged lymph nodes identified. No abdominal aortic aneurysm. Reproductive: 1.5 cm posterior uterine fibroid. Adnexal regions are unremarkable. Other: No evidence of peritoneal thickening, nodularity, or ascites. Musculoskeletal:  No suspicious bone lesions identified. IMPRESSION: No evidence of recurrent or metastatic carcinoma within the chest, abdomen, or pelvis. No other acute findings. Cholelithiasis.  No radiographic evidence of cholecystitis. Hepatic steatosis. Small posterior uterine fibroid. Electronically Signed   By: JEarle GellM.D.   On: 04/09/2017 08:19     Assessment and plan- Patient is a 62y.o. 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  Patient did not desire further chemotherapy due to poor tolerance and comorbidities  Cbc, cmp and cea are normal. Ct chest/ abdomen pelvis images reviewed independently and I have discussed findings with the patient. She has no evidence of recurrence at this time. Repeat cbc/cmp and CEA as well as ct chest abdomen pelvis in 6 montsh and I will see her thereafter  Patient is interested in meeting with surgery to discuss her ileostomy reversal. She would also like her port to be taken out. I will therefore refer her to surgery for the same.  Patient has a total colectomy. I discussed with GI on call Dr. AVicente Malesand he recommends getting sigmoidoscopy before ileostomy reversal. I will refer her to GI for the same.  I will see her back in 6 months as above    Visit Diagnosis 1. Malignant neoplasm of colon, unspecified part of colon ([place]   2. Encounter for follow-up surveillance of colon cancer      Dr. ARanda Evens MD, MPH [facility] LL[facility]ager- [id number]/22/2019 2:00 PM

## 2017-04-27 ENCOUNTER — Other Ambulatory Visit: Payer: Self-pay | Admitting: *Deleted

## 2017-04-27 ENCOUNTER — Encounter: Payer: Self-pay | Admitting: Surgery

## 2017-04-27 ENCOUNTER — Telehealth: Payer: Self-pay | Admitting: *Deleted

## 2017-04-27 ENCOUNTER — Ambulatory Visit (INDEPENDENT_AMBULATORY_CARE_PROVIDER_SITE_OTHER): Payer: BLUE CROSS/BLUE SHIELD | Admitting: Surgery

## 2017-04-27 VITALS — BP 116/78 | HR 98 | Temp 98.1°F | Ht 61.0 in | Wt 146.0 lb

## 2017-04-27 DIAGNOSIS — C187 Malignant neoplasm of sigmoid colon: Secondary | ICD-10-CM | POA: Diagnosis not present

## 2017-04-27 NOTE — Patient Instructions (Signed)
We will send a referral to [facility] so they could do your Sigmoidoscopy.  We will see you back at the office to remove your port-a-cath.

## 2017-04-27 NOTE — Telephone Encounter (Signed)
Called the pt's home and let her husband know that Dr . Janese Banks wanted to have pt. Have a ct of chest along with ct of abd/pelvis 8/30.  He is ok with this.  Also Dr. Janese Banks rec: gi consult to have sigmoidoscopy before she would have a poss. Surgery to have reversal of ileostomy.  They went to see surgeon today and feel that it is not a good idea to try reversal but rec: the GI consult also and made appt 4/9 to se GI

## 2017-04-28 ENCOUNTER — Encounter: Payer: Self-pay | Admitting: Surgery

## 2017-04-28 NOTE — Progress Notes (Signed)
Surgical Consultation  04/28/2017  Theresa Brock is an 62 y.o. female.   Chief Complaint  Patient presents with  . New Patient (Initial Visit)    Ileostomy reversal and pot-a-cath removal     HPI: 62 year old female seen in consultation at the request of Dr. Janese Banks.  He had a history of colon cancer requiring a subtotal colectomy w end ileostomy in 2018 by Dr. Mardene Speak [place]. ( report reviewed)   Pathology showed invasive adenocarcinoma ring-type involving the cecum and the ascending colon. Tubulovillous adenoma with high-grade dysplasia in the ascending colon. 2 small tubular adenomas in the ascending colon. One inflammatory polyp in the ascending colon. Serrated adenoma in the transverse colon. 8 out of 19 pericolonic lymph nodes were positive for tumor. No loss of nuclear expression of MMR proteins. Tumor was poorly differentiated and invades through the muscularis propria into pericolonic tissue. No microscopy perforation. L VIN PNI were present. Margins were negative. pT3pN2b  Apparently after chemotherapy she also significant complications requiring inotropic support and fluid resuscitation in the ICU.  He does have a history of a stroke and comes in a wheelchair.  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  Is able to walk for a few feet with a cane/walker.  She is very debilitated.  The husband reports that her ileostomy is working but has retracted significantly and has had a lot of issues with getting the adequate ostomy back to see well.  She did not complete the full course of FOLFOX because of weakness. He recently had a CT of the abdomen and pelvis that I have personally reviewed showing no evidence of recurrent metastatic disease.  No evidence of an acute abdominal pathology   Past Medical History:  Diagnosis Date  . Colon cancer ([place])   . Diplopia 07/05/2014   Overview:  Converted from Centricity: Description - DIPLOPIA  . Dysphagia,  oropharyngeal phase 09/20/2013   Overview:  Converted from Centricity: Description - DYSPHAGIA, OROPHARYNGEAL PHASE  . Essential (primary) hypertension 09/20/2013   Overview:  Converted from Centricity: Description - HYPERTENSION, BENIGN ESSENTIAL  . GERD (gastroesophageal reflux disease)   . Hyperlipidemia   . Hypertension   . Malignant neoplasm of colon ([place]) 01/23/2017  . Stroke ([place])   . Tinea unguium 09/20/2013   Overview:  Converted from Centricity: Description - ONYCHOMYCOSIS, TOENAILS    Past Surgical History:  Procedure Laterality Date  . COLOSTOMY      Family History  Problem Relation Age of Onset  . Cancer Brother     Social History:  reports that she has never smoked. She has never used smokeless tobacco. She reports that she does not drink alcohol or use drugs.  Allergies: No Known Allergies  Medications reviewed.     ROS Full ROS performed and is otherwise negative other than what is stated in the HPI    BP 116/78   Pulse 98   Temp 98.1 F (36.7 C) (Oral)   Ht 5' 1"  (1.549 m)   Wt 66.2 kg (146 lb)   BMI 27.59 kg/m   Physical Exam  Constitutional: She is oriented to person, place, and time. No distress.  Debilitated pt in a wheelchair  Eyes: Pupils are equal, round, and reactive to light. Right eye exhibits no discharge. Left eye exhibits no discharge. No scleral icterus.  Neck: Neck supple. No JVD present. No tracheal deviation present. No thyromegaly present.  Cardiovascular: Normal rate.  No murmur heard. Pulmonary/Chest: Effort normal. No stridor. No respiratory  distress. She has no wheezes. She has no rales.  Abdominal: She exhibits no distension. There is no tenderness. There is no rebound and no guarding.  Ileostomy in place with significant retraction I can barely see the mucosa.  Musculoskeletal: Normal range of motion. She exhibits no edema or tenderness.  Lymphadenopathy:    She has no cervical adenopathy.  Neurological: She is alert and  oriented to person, place, and time. GCS score is 15.  Left hemiplegia baseline,  Skin: Skin is warm and dry. She is not diaphoretic.  Psychiatric: Memory, affect and judgment normal.  Nursing note and vitals reviewed.  Assessment/Plan: 1. 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  At this time she is not a good operative candidate for ileostomy takedown due to her multiple comorbidities, poor functional status and poor nutritional status.  Discussed with the patient and with the family in detail that also an ileostomy takedown will imply that the patient will likely be incontinent given her neurological disease.  Once I mentioned this the patient was not interested in having the ileostomy reversed.  I did give her the option of doing an ileostomy revision for a better care of the ostomy fitting.  They are in agreement with that and they wish to proceed.  In the meantime we will obtain a flex sig to make sure there is no remaining cancer before any surgical intervention is performed.  Please note that I spent greater than 60 minutes in this encounter with majority of time spent in coordination and counseling of her care.  Please note that copy of this report will be sent to the referring provider  Caroleen Hamman, MD [facility] General Surgeon

## 2017-04-30 ENCOUNTER — Ambulatory Visit: Payer: BLUE CROSS/BLUE SHIELD

## 2017-05-11 ENCOUNTER — Ambulatory Visit: Payer: Self-pay | Admitting: Family Medicine

## 2017-05-12 ENCOUNTER — Other Ambulatory Visit: Payer: Self-pay

## 2017-05-12 ENCOUNTER — Ambulatory Visit: Payer: BLUE CROSS/BLUE SHIELD | Admitting: Gastroenterology

## 2017-05-12 ENCOUNTER — Encounter: Payer: Self-pay | Admitting: Gastroenterology

## 2017-05-12 VITALS — BP 125/86 | HR 106 | Temp 98.2°F | Resp 16 | Ht 61.0 in | Wt 150.8 lb

## 2017-05-12 DIAGNOSIS — Z9889 Other specified postprocedural states: Secondary | ICD-10-CM | POA: Diagnosis not present

## 2017-05-12 DIAGNOSIS — C189 Malignant neoplasm of colon, unspecified: Secondary | ICD-10-CM

## 2017-05-12 MED ORDER — LOPERAMIDE HCL 2 MG PO CAPS: 4.0000 mg | ORAL_CAPSULE | Freq: Four times a day (QID) | ORAL | 0 refills | Status: AC | PRN

## 2017-05-12 NOTE — Progress Notes (Signed)
Theresa Darby, MD [address]  [place]  [place],  [address]  Main: [phone]  Fax: [phone]    Gastroenterology Consultation  Referring Provider:     Sindy Guadeloupe, MD Primary Care Physician:  Patient, No Pcp Per Primary Gastroenterologist:  Dr. Cephas Brock Reason for Consultation:     Examination of rectal stump        HPI:   Theresa Brock is a 62 y.o. female referred by Dr. Dahlia Brock  for consultation & management of examination of rectal stump. Patient has history of stroke, left-sided hemiplegia in 2015, restricted mobility, stage III colon cancer pT3 pN2 cM0 status post total colectomy and end ileostomy and 5 cycles of adjuvant chemotherapy with FOLFOX ending in October 2018.  Patient did not desire further chemotherapy due to poor tolerance and comorbidities. Patient reports that she has been having issues with the ileal stoma, leakage around the stoma. The stomal output is applesauce consistency, nonbloody. She takes Imodium as needed. She denies abdominal pain, bloating, nausea, vomiting. Her husband takes care of the stomal appliance and changes several times a day because of the concerns of leakage from stoma. There is recently moved to [place]. She was seen by Dr. Dahlia Brock to discuss about takedown of the ileostomy who is hesitant to perform the takedown because of her comorbidities and concern about incontinence, he discussed with them about revision of the stoma as an alternative option. Patient is referred by him for evaluation of the rectal stump prior to operative management. Patient recently had a CT by Dr. Janese Brock and there is no evidence of recurrence or metastatic carcinoma.  NSAIDs: none  Antiplts/Anticoagulants/Anti thrombotics: none  GI Procedures: colonoscopy at the time of diagnosis of colon cancer in 2018 She did not have the rectal stump examined after the surgery  Past Medical History:  Diagnosis Date  . Colon cancer ([place])   . Diplopia  07/05/2014   Overview:  Converted from Centricity: Description - DIPLOPIA  . Dysphagia, oropharyngeal phase 09/20/2013   Overview:  Converted from Centricity: Description - DYSPHAGIA, OROPHARYNGEAL PHASE  . Essential (primary) hypertension 09/20/2013   Overview:  Converted from Centricity: Description - HYPERTENSION, BENIGN ESSENTIAL  . GERD (gastroesophageal reflux disease)   . Hyperlipidemia   . Hypertension   . Malignant neoplasm of colon ([place]) 01/23/2017  . Stroke ([place])   . Tinea unguium 09/20/2013   Overview:  Converted from Centricity: Description - ONYCHOMYCOSIS, TOENAILS    Past Surgical History:  Procedure Laterality Date  . COLOSTOMY      Prior to Admission medications   Medication Sig Start Date End Date Taking? Authorizing Provider  atorvastatin (LIPITOR) 20 MG tablet  11/29/16  Yes [provider]  losartan (COZAAR) 25 MG tablet  11/29/16  Yes [provider]  Multiple Vitamin (MULTIVITAMIN) tablet Take 1 tablet by mouth daily.   Yes [provider]  Potassium 99 MG TABS Take by mouth.   Yes [provider]  loperamide (IMODIUM) 2 MG capsule Take 2 capsules (4 mg total) by mouth every 6 (six) hours as needed for diarrhea or loose stools. 05/12/17 06/11/17  Lin Landsman, MD    Family History  Problem Relation Age of Onset  . Cancer Brother      Social History   Tobacco Use  . Smoking status: Never Smoker  . Smokeless tobacco: Never Used  Substance Use Topics  . Alcohol use: No  . Drug use: No    Allergies  as of 05/12/2017  . (No Known Allergies)    Review of Systems:    All systems reviewed and negative except where noted in HPI.   Physical Exam:  BP 125/86   Pulse (!) 106   Temp 98.2 F (36.8 C) (Oral)   Resp 16   Ht 5\' 1"  (1.549 m)   Wt 150 lb 12.8 oz (68.4 kg)   BMI 28.49 kg/m  No LMP recorded. Patient is postmenopausal.  General:   Alert,  Well-developed, well-nourished, pleasant and cooperative in NAD,  sitting in wheelchair Head:  Normocephalic and atraumatic. Eyes:  Sclera clear, no icterus.   Conjunctiva pink. Ears:  Normal auditory acuity. Nose:  No deformity, discharge, or lesions. Mouth:  No deformity or lesions,oropharynx pink & moist. Neck:  Supple; no masses or thyromegaly. Lungs:  Respirations even and unlabored.  Clear throughout to auscultation.   No wheezes, crackles, or rhonchi. No acute distress. Heart:  Regular rate and rhythm; no murmurs, clicks, rubs, or gallops. Abdomen:  Normal bowel sounds. Soft, non-tender, obese, ileal stoma in the right lower quadrant, greenish stool in the stomal bag and non-distended without masses, hepatosplenomegaly or hernias noted.  No guarding or rebound tenderness.   Rectal: Not performed Pulses:  Normal pulses noted. Extremities:  No clubbing or edema.  No cyanosis. Neurologic:  Alert and oriented x3;  Left hemiplegia Skin:  Intact without significant lesions or rashes. No jaundice. Psych:  Alert and cooperative. Normal mood and affect.  Imaging Studies: reviewed  Assessment and Plan:   Theresa Brock is a 62 y.o. Caucasian female with history of CVA, left-sided hemiplegia, stage III colon cancer, status post total colectomy and end ileostomy, chemotherapy with FOLFOX regimen, currently with no recurrence of the disease seen in consultation for examination of the rectal stump. Patient also has loose stomal output  Ileostomy output: Discussed with her about trial of scheduled Imodium and Metamucil to improve consistency of the output. This can minimize the leakage issues There is no indication for ileoscopy Patient does not have anemia I also offered her to see ostomy wound care for better management of the stoma and address issues such as stomal leakage, for this we need to refer her to [facility] colorectal surgery group. Patient would like to try Imodium first before deciding to see wound ostomy nurse  History of adenocarcinoma of the  colon Status post total colectomy and adjuvant chemotherapy FOLFOX 5 cycles Recommend flexible sigmoidoscopy for examination of the rectal stump Can be done under moderate sedation  Follow up based on the findings from flexible sigmoidoscopy   Theresa Darby, MD

## 2017-05-14 ENCOUNTER — Encounter
Admission: RE | Disposition: A | Discharge status: Home / Self Care | Payer: Self-pay | Source: Ambulatory Visit | Attending: Gastroenterology

## 2017-05-14 ENCOUNTER — Encounter: Payer: Self-pay | Admitting: *Deleted

## 2017-05-14 ENCOUNTER — Ambulatory Visit
Admission: RE | Admit: 2017-05-14 | Discharge: 2017-05-14 | Disposition: A | Discharge status: Home / Self Care | Payer: BLUE CROSS/BLUE SHIELD | Source: Ambulatory Visit | Attending: Gastroenterology | Admitting: Gastroenterology

## 2017-05-14 DIAGNOSIS — Z9889 Other specified postprocedural states: Secondary | ICD-10-CM | POA: Diagnosis not present

## 2017-05-14 DIAGNOSIS — Z79899 Other long term (current) drug therapy: Secondary | ICD-10-CM | POA: Diagnosis not present

## 2017-05-14 DIAGNOSIS — Z933 Colostomy status: Secondary | ICD-10-CM | POA: Insufficient documentation

## 2017-05-14 DIAGNOSIS — E785 Hyperlipidemia, unspecified: Secondary | ICD-10-CM | POA: Diagnosis not present

## 2017-05-14 DIAGNOSIS — I10 Essential (primary) hypertension: Secondary | ICD-10-CM | POA: Diagnosis not present

## 2017-05-14 DIAGNOSIS — Z08 Encounter for follow-up examination after completed treatment for malignant neoplasm: Secondary | ICD-10-CM | POA: Diagnosis not present

## 2017-05-14 DIAGNOSIS — Z9049 Acquired absence of other specified parts of digestive tract: Secondary | ICD-10-CM | POA: Insufficient documentation

## 2017-05-14 DIAGNOSIS — Z85038 Personal history of other malignant neoplasm of large intestine: Secondary | ICD-10-CM | POA: Insufficient documentation

## 2017-05-14 DIAGNOSIS — C189 Malignant neoplasm of colon, unspecified: Secondary | ICD-10-CM | POA: Diagnosis not present

## 2017-05-14 DIAGNOSIS — Z8673 Personal history of transient ischemic attack (TIA), and cerebral infarction without residual deficits: Secondary | ICD-10-CM | POA: Insufficient documentation

## 2017-05-14 HISTORY — PX: FLEXIBLE SIGMOIDOSCOPY: SHX5431

## 2017-05-14 SURGERY — SIGMOIDOSCOPY, FLEXIBLE
Anesthesia: Moderate Sedation

## 2017-05-14 MED ORDER — HEPARIN SOD (PORK) LOCK FLUSH 100 UNIT/ML IV SOLN
INTRAVENOUS | Status: AC
  Administered 2017-05-14: 500 [IU]
  Filled 2017-05-14: qty 5

## 2017-05-14 MED ORDER — HEPARIN SOD (PORK) LOCK FLUSH 100 UNIT/ML IV SOLN: 500.0000 [IU] | INTRAVENOUS | Status: DC | PRN

## 2017-05-14 MED ORDER — MIDAZOLAM HCL 5 MG/5ML IJ SOLN
INTRAMUSCULAR | Status: AC
  Filled 2017-05-14: qty 5

## 2017-05-14 MED ORDER — SODIUM CHLORIDE 0.9% FLUSH
10.0000 mL | INTRAVENOUS | Status: AC | PRN
  Administered 2017-05-14: 10 mL

## 2017-05-14 MED ORDER — FENTANYL CITRATE (PF) 100 MCG/2ML IJ SOLN
INTRAMUSCULAR | Status: AC
  Filled 2017-05-14: qty 2

## 2017-05-14 MED ORDER — MIDAZOLAM HCL 5 MG/5ML IJ SOLN
INTRAMUSCULAR | Status: DC | PRN
  Administered 2017-05-14 (×2): 1 mg via INTRAVENOUS

## 2017-05-14 MED ORDER — FENTANYL CITRATE (PF) 100 MCG/2ML IJ SOLN
INTRAMUSCULAR | Status: DC | PRN
  Administered 2017-05-14 (×2): 25 ug via INTRAVENOUS

## 2017-05-14 MED ORDER — SODIUM CHLORIDE 0.9 % IV SOLN
INTRAVENOUS | Status: DC
  Administered 2017-05-14: 11:00:00 via INTRAVENOUS

## 2017-05-14 NOTE — Op Note (Signed)
[facility]astroenter[facility] Patient Name: Theresa Brock Procedure Date: 05/14/2017 12:16 PM MRN: [id number] Account #: [id number] Date of Birth: 1956-01-08 Admit Type: Outpatient Age: 62 Room: [facility] ENDO ROOM 2 Gender: Female Note Status: Finalized Procedure:            Endoscopy of Hartmann Pouch Indications:          History of total colectomy, Monitoring for anastomotic                        recurrence of colon malignancy, Personal history of                        malignant neoplasm of the colon Providers:            Lin Landsman MD, MD Referring MD:         No Local Md, MD (Referring MD) Medicines:            Fentanyl 50 micrograms IV, Midazolam 2 mg IV Complications:        No immediate complications. Estimated blood loss: None. Procedure:            Pre-Anesthesia Assessment:                       - Prior to the procedure, a History and Physical was                        performed, and patient medications and allergies were                        reviewed. The patient is competent. The risks and                        benefits of the procedure and the sedation options and                        risks were discussed with the patient. All questions                        were answered and informed consent was obtained.                        Patient identification and proposed procedure were                        verified by the physician, the nurse and the technician                        in the pre-procedure area in the procedure room in the                        endoscopy suite. Mental Status Examination: alert and                        oriented. Airway Examination: normal oropharyngeal                        airway and neck mobility. Respiratory Examination:  clear to auscultation. CV Examination: normal.                        Prophylactic Antibiotics: The patient does not require                        prophylactic  antibiotics. Prior Anticoagulants: The                        patient has taken no previous anticoagulant or                        antiplatelet agents. ASA Grade Assessment: III - A                        patient with severe systemic disease. After reviewing                        the risks and benefits, the patient was deemed in                        satisfactory condition to undergo the procedure. The                        anesthesia plan was to use moderate sedation /                        analgesia (conscious sedation). Immediately prior to                        administration of medications, the patient was                        re-assessed for adequacy to receive sedatives. The                        heart rate, respiratory rate, oxygen saturations, blood                        pressure, adequacy of pulmonary ventilation, and                        response to care were monitored throughout the                        procedure. The physical status of the patient was                        re-assessed after the procedure.                       After obtaining informed consent, the endoscope was                        passed under direct vision. Throughout the procedure,                        the patient's blood pressure, pulse, and oxygen                        saturations were monitored continuously.  The Endoscope                        was introduced through the anus and advanced to the the                        [facility] pouch. The procedure was performed without                        difficulty. The patient tolerated the procedure well.                        The quality of the bowel preparation was excellent. Findings:      The Hartmann pouch, length 15cm, appeared normal. Impression:           - The Hartmann pouch and area at 15 cm proximal to the                        anus are normal.                       - No specimens collected. Recommendation:       - Discharge  patient to home.                       - Resume previous diet today.                       - Repeat post-surgical lower GI endoscopy in 1 year for                        surveillance. Procedure Code(s):    --- Professional ---                       [phone], Sigmoidoscopy, flexible; diagnostic, including                        collection of specimen(s) by brushing or washing, when                        performed (separate procedure) Diagnosis Code(s):    --- Professional ---                       Z90.49, Acquired absence of other specified parts of                        digestive tract                       Z08, Encounter for follow-up examination after                        completed treatment for malignant neoplasm                       Z85.038, Personal history of other malignant neoplasm                        of large intestine CPT copyright 2017 American Medical Association. All rights reserved. The codes documented in this report are preliminary and upon coder review may  be  revised to meet current compliance requirements. Dr. Ulyess Mort Lin Landsman MD, MD 05/14/2017 12:28:05 PM This report has been signed electronically. Number of Addenda: 0 Note Initiated On: 05/14/2017 12:16 PM Total Procedure Duration: 0 hours 2 minutes 36 seconds       [facility]

## 2017-05-14 NOTE — H&P (Signed)
Cephas Darby, MD [address]  [place]  [place], [place] [address]  Main: [phone]  Fax: [phone] Pager: [phone]  Primary Care Physician:  Patient, No Pcp Per Primary Gastroenterologist:  Dr. Cephas Darby  Pre-Procedure History & Physical: HPI:  Anaiz Qazi is a 62 y.o. female is here for an flexible sigmoidoscopy.   Past Medical History:  Diagnosis Date  . Colon cancer ([place])   . Diplopia 07/05/2014   Overview:  Converted from Centricity: Description - DIPLOPIA  . Dysphagia, oropharyngeal phase 09/20/2013   Overview:  Converted from Centricity: Description - DYSPHAGIA, OROPHARYNGEAL PHASE  . Essential (primary) hypertension 09/20/2013   Overview:  Converted from Centricity: Description - HYPERTENSION, BENIGN ESSENTIAL  . GERD (gastroesophageal reflux disease)   . Hyperlipidemia   . Hypertension   . Malignant neoplasm of colon ([place]) 01/23/2017  . Stroke ([place])   . Tinea unguium 09/20/2013   Overview:  Converted from Centricity: Description - ONYCHOMYCOSIS, TOENAILS    Past Surgical History:  Procedure Laterality Date  . COLOSTOMY    . PORT A CATH INJECTION ([place] [facility]X) Right    7/18    Prior to Admission medications   Medication Sig Start Date End Date Taking? Authorizing Provider  acetaminophen (TYLENOL) 325 MG tablet Take 650 mg by mouth every 6 (six) hours as needed.   Yes [provider]  atorvastatin (LIPITOR) 20 MG tablet  11/29/16  Yes [provider]  losartan (COZAAR) 25 MG tablet  11/29/16  Yes [provider]  Multiple Vitamin (MULTIVITAMIN) tablet Take 1 tablet by mouth daily.   Yes [provider]  Potassium 99 MG TABS Take by mouth.   Yes [provider]  loperamide (IMODIUM) 2 MG capsule Take 2 capsules (4 mg total) by mouth every 6 (six) hours as needed for diarrhea or loose stools. 05/12/17 06/11/17  Lin Landsman, MD    Allergies as of 05/12/2017  . (No Known Allergies)    Family  History  Problem Relation Age of Onset  . Cancer Brother     Social History   Socioeconomic History  . Marital status: Married    Spouse name: Not on file  . Number of children: Not on file  . Years of education: Not on file  . Highest education level: Not on file  Occupational History  . Not on file  Social Needs  . Financial resource strain: Not on file  . Food insecurity:    Worry: Not on file    Inability: Not on file  . Transportation needs:    Medical: Not on file    Non-medical: Not on file  Tobacco Use  . Smoking status: Never Smoker  . Smokeless tobacco: Never Used  Substance and Sexual Activity  . Alcohol use: No  . Drug use: No  . Sexual activity: Not on file  Lifestyle  . Physical activity:    Days per week: Not on file    Minutes per session: Not on file  . Stress: Not on file  Relationships  . Social connections:    Talks on phone: Not on file    Gets together: Not on file    Attends religious service: Not on file    Active member of club or organization: Not on file    Attends meetings of clubs or organizations: Not on file    Relationship status: Not on file  . Intimate partner violence:    Fear of current or ex partner:  Not on file    Emotionally abused: Not on file    Physically abused: Not on file    Forced sexual activity: Not on file  Other Topics Concern  . Not on file  Social History Narrative  . Not on file    Review of Systems: See HPI, otherwise negative ROS  Physical Exam: BP 134/85   Pulse 96   Temp (!) 97.3 F (36.3 C) (Tympanic)   Resp 16   Ht 5\' 1"  (1.549 m)   Wt 150 lb (68 kg)   SpO2 100%   BMI 28.34 kg/m  General:   Alert,  pleasant and cooperative in NAD Head:  Normocephalic and atraumatic. Neck:  Supple; no masses or thyromegaly. Lungs:  Clear throughout to auscultation.    Heart:  Regular rate and rhythm. Abdomen:  Soft, nontender and nondistended. Normal bowel sounds, without guarding, and without rebound.     Neurologic:  Alert and  oriented x4;  grossly normal neurologically.  Impression/Plan: Jasmain Ahlberg is here for an flexible sigmoidoscopy to be performed for examination of hartmann's pouch  Risks, benefits, limitations, and alternatives regarding  flexible sigmoidoscopy have been reviewed with the patient.  Questions have been answered.  All parties agreeable.   Sherri Sear, MD  05/14/2017, 11:22 AM

## 2017-05-21 ENCOUNTER — Encounter: Payer: Self-pay | Admitting: Surgery

## 2017-05-21 ENCOUNTER — Ambulatory Visit (INDEPENDENT_AMBULATORY_CARE_PROVIDER_SITE_OTHER): Payer: BLUE CROSS/BLUE SHIELD | Admitting: Surgery

## 2017-05-21 VITALS — BP 133/90 | HR 102 | Temp 97.8°F | Ht 61.0 in | Wt 152.4 lb

## 2017-05-21 DIAGNOSIS — Z09 Encounter for follow-up examination after completed treatment for conditions other than malignant neoplasm: Secondary | ICD-10-CM | POA: Diagnosis not present

## 2017-05-21 NOTE — Patient Instructions (Signed)

## 2017-05-21 NOTE — Progress Notes (Signed)
Procedure note   DX: Hx colon CA s/p port  Procedure: removal of Right Port   Anesthesia: lidocaine 1% w epi  EBL: minimal  After informed consent was obtained the patient was placed in supine position prepped and draped in the usual sterile fashion.  Local anesthetic infiltrated and using a 15 blade the incision was created over the port site.  Hemostat was used to dissect through subcutaneous tissue and the port was brought outside the skin.  A Valsalva maneuver was performed and the catheter was removed.  The wound was closed in a 2 layer fashion with absorbable sutures interrupted for the subcu and 4-0 Monocryl subcuticular for the skin.  Dermabond was used to coat the skin. No complications

## 2017-06-04 ENCOUNTER — Encounter: Payer: Self-pay | Admitting: Surgery

## 2017-06-04 ENCOUNTER — Ambulatory Visit (INDEPENDENT_AMBULATORY_CARE_PROVIDER_SITE_OTHER): Payer: BLUE CROSS/BLUE SHIELD | Admitting: Surgery

## 2017-06-04 VITALS — BP 106/75 | HR 114 | Temp 98.1°F | Ht 61.0 in | Wt 150.0 lb

## 2017-06-04 DIAGNOSIS — Z09 Encounter for follow-up examination after completed treatment for conditions other than malignant neoplasm: Secondary | ICD-10-CM

## 2017-06-04 NOTE — Patient Instructions (Signed)
Please call our office with any questions or concerns. 

## 2017-06-04 NOTE — Progress Notes (Signed)
S/p port removal Doing well Incision healed They are please w ileostomy F/U prn

## 2017-06-09 ENCOUNTER — Encounter: Payer: Self-pay | Admitting: Physician Assistant

## 2017-06-09 ENCOUNTER — Ambulatory Visit (INDEPENDENT_AMBULATORY_CARE_PROVIDER_SITE_OTHER): Payer: BLUE CROSS/BLUE SHIELD | Admitting: Physician Assistant

## 2017-06-09 VITALS — BP 120/82 | HR 88 | Temp 98.6°F | Resp 16 | Wt 152.0 lb

## 2017-06-09 DIAGNOSIS — R9389 Abnormal findings on diagnostic imaging of other specified body structures: Secondary | ICD-10-CM

## 2017-06-09 DIAGNOSIS — I10 Essential (primary) hypertension: Secondary | ICD-10-CM | POA: Diagnosis not present

## 2017-06-09 DIAGNOSIS — C189 Malignant neoplasm of colon, unspecified: Secondary | ICD-10-CM

## 2017-06-09 DIAGNOSIS — Z932 Ileostomy status: Secondary | ICD-10-CM | POA: Diagnosis not present

## 2017-06-09 DIAGNOSIS — I69959 Hemiplegia and hemiparesis following unspecified cerebrovascular disease affecting unspecified side: Secondary | ICD-10-CM | POA: Diagnosis not present

## 2017-06-09 MED ORDER — ATORVASTATIN CALCIUM 20 MG PO TABS
20.0000 mg | ORAL_TABLET | Freq: Every day | ORAL | 1 refills | Status: DC
Start: 2017-06-09 — End: 2017-11-18

## 2017-06-09 MED ORDER — LOSARTAN POTASSIUM 25 MG PO TABS: 25.0000 mg | ORAL_TABLET | Freq: Every day | ORAL | 1 refills | Status: DC

## 2017-06-09 NOTE — Progress Notes (Signed)
Patient: Theresa Brock Female    DOB: 1955/08/15   62 y.o.   MRN: [id number] Visit Date: 06/10/2017  Today's Provider: Trinna Post, PA-C   Chief Complaint  Patient presents with  . Establish Care  . Hypertension   Subjective:    Theresa Brock is a 62 woman presenting today to establish care. She moves around frequently due to husband's work, they live in an [place] and travel to different workplaces across the country.  Stroke: Patient reports she has a history of right sided hemorrhagic stroke in 2015. She was seen at [facility] for this. She is currently on Lipitor. She reports residual left sided hemiparesis, arm more affected than her left leg. She currently ambulates 100 ft with use of cane, uses wheelchair as well. She wears a leg brace. Unable to use arm much.  Colon Cancer: Patient presented on 05/26/2016 to a [facility] in [place], [place] with small bowel obstruction. To summarize below, patient was found to have colon cancer upon admission and underwent total colectomy with end ileostomy. She started but did not complete chemotherapy. She continues to follow with oncology. Her care is divided between [place] and [place].   Per Dr. 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 Tubulovillous adenoma with high-grade dysplasia in the ascending colon. 2 small tubular adenomas in the ascending colon. One inflammatory polyp in the ascending colon. Serrated adenoma in the transverse colon. 8 out of 19 pericolonic lymph nodes were positive for tumor. No loss of nuclear expression of MMR proteins. Tumor was poorly differentiated and invades through the muscularis propria into pericolonic tissue. No microscopy perforation. L VIN PNI were present. Margins were negative. pT3pN2b  Patient had a complicated postoperative course following the  colectomy with postoperative ileus C. difficile colitis and was admitted in the ICU for septic shock with pressors. There was a considerable delay because of that in starting adjuvant chemotherapy. CBC back in July 2018 was normal with a white count of 10.7, H&H of 12/37 and a platelet count of 445. LFTs were within normal limits and serum creatinine was 0.6. CT chest abdomen and pelvis in July 2018 did not reveal any evidence of recurrent or metastatic disease.  Patient was also seen by GYN for thickened endometrium and underwent transvaginal ultrasound in August 2018 which showed a 2.8 cm fibroid as well as endometrial thickening of 12.8 mm. Plan was for hysteroscopy and D&C in September 2018  Adjuvant chemotherapy with FOLFOX was planned every 2 weeks for 12 cycles which started in August 2018. Adjuvant chemotherapy was initiated on 09/30/2016 and from review of records so far I see that her last chemotherapy was cycle #5 that was given on 11/27/2016 after that chemo patient was admitted to the hospital on 12/07/2016 with renal failure and failure to thrive. Patient did not desire further chemo at that point  Endometrial Thickening: During her imaging for colorectal cancer, she was found to have endometrial thickening. It was recommended to undergo D&C. She reports she had this completed in [place], [place] and there were no cancerous findings. Reports mammogram within the past year or so that was normal.    Hypertension  This is a chronic problem. The problem is unchanged. The problem is controlled. Pertinent negatives include no anxiety, blurred vision, chest pain, headaches, malaise/fatigue, neck pain, orthopnea, palpitations, peripheral edema, PND, shortness of breath or sweats.  There are no associated agents to hypertension.    No Known Allergies   Current Outpatient Medications:  .  acetaminophen (TYLENOL) 325 MG tablet, Take 650 mg by mouth every 6 (six) hours as needed., Disp:  , Rfl:  .  atorvastatin (LIPITOR) 20 MG tablet, Take 1 tablet (20 mg total) by mouth daily at 6 PM., Disp: 90 tablet, Rfl: 1 .  loperamide (IMODIUM) 2 MG capsule, Take 2 capsules (4 mg total) by mouth every 6 (six) hours as needed for diarrhea or loose stools., Disp: 90 capsule, Rfl: 0 .  losartan (COZAAR) 25 MG tablet, Take 1 tablet (25 mg total) by mouth daily., Disp: 90 tablet, Rfl: 1 .  Multiple Vitamin (MULTIVITAMIN) tablet, Take 1 tablet by mouth daily., Disp: , Rfl:  .  Potassium 99 MG TABS, Take by mouth., Disp: , Rfl:   Review of Systems  Constitutional: Negative for malaise/fatigue.  Eyes: Negative for blurred vision.  Respiratory: Negative for shortness of breath.   Cardiovascular: Negative for chest pain, palpitations, orthopnea and PND.  Musculoskeletal: Negative for neck pain.  Neurological: Negative for headaches.   Past Surgical History:  Procedure Laterality Date  . COLOSTOMY    . FLEXIBLE SIGMOIDOSCOPY N/A 05/14/2017   Procedure: FLEXIBLE SIGMOIDOSCOPY;  Surgeon: Lin Landsman, MD;  Location[facility] ENDOSCOPY;  Service: Gastroenterology;  Laterality: N/A;  . PORT A CATH INJECTION ([place] HX) Right    7/18   Family History  Problem Relation Age of Onset  . Cancer Brother   . Stroke Mother   . Lung cancer Father     Social History   Tobacco Use  . Smoking status: Never Smoker  . Smokeless tobacco: Never Used  Substance Use Topics  . Alcohol use: No   Objective:   BP 120/82 (BP Location: Right Arm, Patient Position: Sitting, Cuff Size: Normal)   Pulse 88   Temp 98.6 F (37 C) (Oral)   Resp 16   Wt 152 lb (68.9 kg)   BMI 28.72 kg/m  Vitals:   06/09/17 1431  BP: 120/82  Pulse: 88  Resp: 16  Temp: 98.6 F (37 C)  TempSrc: Oral  Weight: 152 lb (68.9 kg)     Physical Exam  Constitutional: She is oriented to person, place, and time. She appears well-developed and well-nourished.  HENT:  Head: Normocephalic and atraumatic.  Right Ear: External  ear normal.  Left Ear: External ear normal.  Nose: Nose normal.  Mouth/Throat: Oropharynx is clear and moist. No oropharyngeal exudate.  Eyes: Pupils are equal, round, and reactive to light. Conjunctivae are normal.  Neck: Neck supple.  Cardiovascular: Normal rate and regular rhythm.  Pulmonary/Chest: Effort normal and breath sounds normal.  Abdominal: Soft. Bowel sounds are normal.  Musculoskeletal: She exhibits no edema, tenderness or deformity.  Lymphadenopathy:    She has no cervical adenopathy.  Neurological: She is alert and oriented to person, place, and time. A cranial nerve deficit is present.  She has 3/5 strength in her left arm, 4/5 strength in her left lower extremity. Notable left facial droop.   Skin: Skin is warm and dry.  Psychiatric: She has a normal mood and affect. Her behavior is normal.        Assessment & Plan:     1. 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 She is currently on Lipitor 10 mg. She has residual left sided deficits and ambulates  with a combination of wheelchair and cane. She has records of hospitalization that family says they can provide.  - Comprehensive Metabolic Panel (CMET) - Lipid Profile - atorvastatin (LIPITOR) 20 MG tablet; Take 1 tablet (20 mg total) by mouth daily at 6 PM.  Dispense: 90 tablet; Refill: 1  2. Essential (primary) hypertension  Controlled today, continue current medications.  - Comprehensive Metabolic Panel (CMET) - Lipid Profile - losartan (COZAAR) 25 MG tablet; Take 1 tablet (25 mg total) by mouth daily.  Dispense: 90 tablet; Refill: 1  3. Malignant neoplasm of colon, unspecified part of colon ([place])  Underwent total colectomy, currently has end-ileostomy. There has been some difficulties with stoma regarding stool leakage. Family wondered about reversal but surgeon feels this would be too  risky. They have discussed this further with the surgeon. She has not completed chemotherapy and she understands risks involved with this. There has been no evidence of disease recurrence on recent imaging, she is followed by oncology.  4. Endometrial thickening on ultrasound  Incidental finding on imaging for colon cancer. She reports undergoing D&C in [place] that she has records for and can provide.   Return in about 6 months (around 12/10/2017) for HTN.  The entirety of the information documented in the History of Present Illness, Review of Systems and Physical Exam were personally obtained by me. Portions of this information were initially documented by Ashley Royalty, CMA and reviewed by me for thoroughness and accuracy.   I have spent 45 minutes with this patient, >50% of which was spent on counseling and coordination of care.          Trinna Post, PA-C  [facility]

## 2017-06-10 LAB — COMPREHENSIVE METABOLIC PANEL WITH GFR
ALT: 27 IU/L (ref 0–32)
AST: 20 IU/L (ref 0–40)
Albumin/Globulin Ratio: 1.5 (ref 1.2–2.2)
Albumin: 4.3 g/dL (ref 3.6–4.8)
Alkaline Phosphatase: 163 IU/L — ABNORMAL HIGH (ref 39–117)
BUN/Creatinine Ratio: 25 (ref 12–28)
BUN: 17 mg/dL (ref 8–27)
Bilirubin Total: 0.2 mg/dL (ref 0.0–1.2)
CO2: 23 mmol/L (ref 20–29)
Calcium: 9.9 mg/dL (ref 8.7–10.3)
Chloride: 103 mmol/L (ref 96–106)
Creatinine, Ser: 0.68 mg/dL (ref 0.57–1.00)
GFR calc Af Amer: 107 mL/min/1.73
GFR calc non Af Amer: 93 mL/min/1.73
Globulin, Total: 2.8 g/dL (ref 1.5–4.5)
Glucose: 99 mg/dL (ref 65–99)
Potassium: 4.5 mmol/L (ref 3.5–5.2)
Sodium: 142 mmol/L (ref 134–144)
Total Protein: 7.1 g/dL (ref 6.0–8.5)

## 2017-06-10 LAB — LIPID PANEL
Chol/HDL Ratio: 2.8 ratio (ref 0.0–4.4)
Cholesterol, Total: 171 mg/dL (ref 100–199)
HDL: 61 mg/dL
LDL Calculated: 80 mg/dL (ref 0–99)
Triglycerides: 149 mg/dL (ref 0–149)
VLDL Cholesterol Cal: 30 mg/dL (ref 5–40)

## 2017-06-11 ENCOUNTER — Telehealth: Payer: Self-pay

## 2017-06-11 NOTE — Telephone Encounter (Signed)
-----   Message from Trinna Post, [place] sent at 06/10/2017  1:05 PM EDT ----- Theresa Brock is normal, cholesterol at goal. I think she can stop her potassium supplements since her low potassium was in the context of chemo treatment and she has never had this issue before.

## 2017-06-11 NOTE — Telephone Encounter (Signed)
Pt advised.   Thanks,   -Theresa Brock  

## 2017-07-02 ENCOUNTER — Telehealth: Payer: Self-pay | Admitting: Physician Assistant

## 2017-07-02 NOTE — Telephone Encounter (Signed)
Can he please have the medical supply company send over a fax of what they are using? I can then either sign it or fax back a hard script. I don't know how to order it otherwise as there are many different orders in the system.

## 2017-07-02 NOTE — Telephone Encounter (Signed)
Patient's husband, Theresa Brock, called stating Theresa Brock need another prescription for her ileostomy supplies.  He said the 1 piece bags work better for her than the 2 piece please.   He needs this sent to [facility] fax #  [phone].

## 2017-07-16 ENCOUNTER — Inpatient Hospital Stay: Payer: BLUE CROSS/BLUE SHIELD | Attending: Oncology

## 2017-07-21 ENCOUNTER — Telehealth: Payer: Self-pay

## 2017-07-21 NOTE — Telephone Encounter (Signed)
[place] from [place] called to follow up on forms faxed on 07/16/17 for ostomy supplies and office notes. [place] said she will refax forms. Mickel Baas advised.

## 2017-07-22 DIAGNOSIS — Z932 Ileostomy status: Secondary | ICD-10-CM | POA: Insufficient documentation

## 2017-08-03 ENCOUNTER — Ambulatory Visit: Payer: BLUE CROSS/BLUE SHIELD | Admitting: Family Medicine

## 2017-08-31 ENCOUNTER — Ambulatory Visit: Payer: BLUE CROSS/BLUE SHIELD | Admitting: Physician Assistant

## 2017-08-31 ENCOUNTER — Ambulatory Visit
Admission: RE | Admit: 2017-08-31 | Discharge: 2017-08-31 | Disposition: A | Discharge status: Home / Self Care | Payer: BLUE CROSS/BLUE SHIELD | Source: Ambulatory Visit | Attending: Physician Assistant | Admitting: Physician Assistant

## 2017-08-31 ENCOUNTER — Encounter: Payer: Self-pay | Admitting: Physician Assistant

## 2017-08-31 VITALS — BP 128/70 | HR 112 | Temp 98.5°F | Resp 16 | Wt 157.2 lb

## 2017-08-31 DIAGNOSIS — M25562 Pain in left knee: Secondary | ICD-10-CM | POA: Insufficient documentation

## 2017-08-31 DIAGNOSIS — M238X2 Other internal derangements of left knee: Secondary | ICD-10-CM | POA: Diagnosis not present

## 2017-08-31 MED ORDER — METHYLPREDNISOLONE 4 MG PO TBPK: ORAL_TABLET | ORAL | 0 refills | Status: DC

## 2017-08-31 NOTE — Progress Notes (Signed)
Patient: Theresa Brock Female    DOB: 07/26/1955   62 y.o.   MRN: [id number] Visit Date: 08/31/2017  Today's Provider: Mar Daring, PA-C   No chief complaint on file.  Subjective:    Knee Pain   The incident occurred 3 to 5 days ago. There was no injury mechanism. The pain is present in the left knee (radiates to he left hip). The quality of the pain is described as aching (Dull, and sharp). The pain is at a severity of 8/10. The pain is moderate. The pain has been fluctuating since onset. Associated symptoms include numbness. Pertinent negatives include no inability to bear weight. Associated symptoms comments: Swelling. She reports no foreign bodies present. The symptoms are aggravated by movement. She has tried ice, elevation and rest for the symptoms. The treatment provided no relief.   Symptoms are aggravated since patient had a stroke in 2015 and has been partially hemi paralyzed on the left side. She does lean to the left when sitting. She is completely paralyzed of the left arm. The left leg she can move some and walk. She does wear a brace on the left lower leg to keep her foot dorsiflexed to prevent dragging drop foot. Her husband does report that she ambulates at home with a cane but does drag her left foot and leg.     No Known Allergies   Current Outpatient Medications:  .  acetaminophen (TYLENOL) 325 MG tablet, Take 650 mg by mouth every 6 (six) hours as needed., Disp: , Rfl:  .  atorvastatin (LIPITOR) 20 MG tablet, Take 1 tablet (20 mg total) by mouth daily at 6 PM., Disp: 90 tablet, Rfl: 1 .  losartan (COZAAR) 25 MG tablet, Take 1 tablet (25 mg total) by mouth daily., Disp: 90 tablet, Rfl: 1 .  Multiple Vitamin (MULTIVITAMIN) tablet, Take 1 tablet by mouth daily., Disp: , Rfl:   Review of Systems  Constitutional: Negative.   Respiratory: Negative.   Cardiovascular: Negative.   Musculoskeletal: Positive for arthralgias, gait problem and joint swelling.    Neurological: Positive for weakness and numbness.    Social History   Tobacco Use  . Smoking status: Never Smoker  . Smokeless tobacco: Never Used  Substance Use Topics  . Alcohol use: No   Objective:   BP 128/70 (BP Location: Right Arm, Patient Position: Sitting, Cuff Size: Normal)   Pulse (!) 112   Temp 98.5 F (36.9 C) (Oral)   Resp 16   Wt 157 lb 3.2 oz (71.3 kg)   BMI 29.70 kg/m  Vitals:   08/31/17 1125  BP: 128/70  Pulse: (!) 112  Resp: 16  Temp: 98.5 F (36.9 C)  TempSrc: Oral  Weight: 157 lb 3.2 oz (71.3 kg)     Physical Exam  Constitutional: She appears well-developed and well-nourished. No distress.  Neck: Normal range of motion. Neck supple.  Cardiovascular: Normal rate, regular rhythm and normal heart sounds. Exam reveals no gallop and no friction rub.  No murmur heard. Pulmonary/Chest: Effort normal and breath sounds normal. No respiratory distress. She has no wheezes. She has no rales.  Musculoskeletal:       Left hip: Normal.       Left knee: She exhibits decreased range of motion, swelling, LCL laxity and MCL laxity. She exhibits normal patellar mobility and no bony tenderness. Tenderness found. Medial joint line and lateral joint line tenderness noted. No patellar tendon tenderness noted.  Slight laxity noted  in all 4 ligaments compared to right. Possibly due to dragging and weakness.   Skin: She is not diaphoretic.  Vitals reviewed.      Assessment & Plan:     1. Acute pain of left knee Possibly twisted or sprained knee unknowingly due to dragging left leg, possibly meniscal. Unable to perform McMurrays due to patient being in wheelchair. Also having pain along left hip along the greater trochanter and through the IT band muscle. Will get imaging as below to r/o any bony abnormality. Given medrol dose pak as below. Continue RICE treatment. Call if no improvements. May consider HHPT vs referral to orthopedics.  - DG Knee Complete 4 Views Left;  Future - methylPREDNISolone (MEDROL DOSEPAK) 4 MG TBPK tablet; 6 day taper; take as directed on package instructions  Dispense: 21 tablet; Refill: 0       Mar Daring, PA-C  [facility]

## 2017-08-31 NOTE — Patient Instructions (Signed)

## 2017-09-02 ENCOUNTER — Telehealth: Payer: Self-pay

## 2017-09-02 NOTE — Telephone Encounter (Signed)
Pt's husband Wynetta Emery advised as directed below.  Thanks,  -Joseline

## 2017-09-02 NOTE — Telephone Encounter (Signed)
-----   Message from Mar Daring, [place] sent at 09/01/2017  1:28 PM EDT ----- No acute abnormality noted but there is significant arthritic changes.

## 2017-09-06 ENCOUNTER — Emergency Department
Admission: EM | Admit: 2017-09-06 | Discharge: 2017-09-06 | Disposition: A | Discharge status: Home / Self Care | Payer: BLUE CROSS/BLUE SHIELD | Attending: Emergency Medicine | Admitting: Emergency Medicine

## 2017-09-06 ENCOUNTER — Other Ambulatory Visit: Payer: Self-pay

## 2017-09-06 DIAGNOSIS — M1712 Unilateral primary osteoarthritis, left knee: Secondary | ICD-10-CM

## 2017-09-06 DIAGNOSIS — Z79899 Other long term (current) drug therapy: Secondary | ICD-10-CM | POA: Diagnosis not present

## 2017-09-06 DIAGNOSIS — Z85038 Personal history of other malignant neoplasm of large intestine: Secondary | ICD-10-CM | POA: Diagnosis not present

## 2017-09-06 DIAGNOSIS — I69319 Unspecified symptoms and signs involving cognitive functions following cerebral infarction: Secondary | ICD-10-CM | POA: Diagnosis not present

## 2017-09-06 DIAGNOSIS — M25552 Pain in left hip: Secondary | ICD-10-CM | POA: Diagnosis present

## 2017-09-06 DIAGNOSIS — I69354 Hemiplegia and hemiparesis following cerebral infarction affecting left non-dominant side: Secondary | ICD-10-CM | POA: Insufficient documentation

## 2017-09-06 DIAGNOSIS — M7062 Trochanteric bursitis, left hip: Secondary | ICD-10-CM

## 2017-09-06 DIAGNOSIS — Y939 Activity, unspecified: Secondary | ICD-10-CM | POA: Diagnosis not present

## 2017-09-06 MED ORDER — DICLOFENAC SODIUM 1 % TD GEL: 4.0000 g | Freq: Four times a day (QID) | TRANSDERMAL | 0 refills | Status: DC | PRN

## 2017-09-06 MED ORDER — TRIAMCINOLONE ACETONIDE 40 MG/ML IJ SUSP
40.0000 mg | Freq: Once | INTRAMUSCULAR | Status: AC
  Administered 2017-09-06: 40 mg via INTRA_ARTICULAR

## 2017-09-06 MED ORDER — HYDROCODONE-ACETAMINOPHEN 5-325 MG PO TABS: 1.0000 | ORAL_TABLET | Freq: Four times a day (QID) | ORAL | 0 refills | Status: DC | PRN

## 2017-09-06 MED ORDER — TRIAMCINOLONE HEXACETONIDE 20 MG/ML IJ SUSP: 20.0000 mg | Freq: Once | INTRAMUSCULAR | Status: DC

## 2017-09-06 MED ORDER — TRIAMCINOLONE ACETONIDE 40 MG/ML IJ SUSP
40.0000 mg | Freq: Once | INTRAMUSCULAR | Status: DC
  Filled 2017-09-06: qty 1

## 2017-09-06 MED ORDER — BUPIVACAINE HCL (PF) 0.5 % IJ SOLN
30.0000 mL | Freq: Once | INTRAMUSCULAR | Status: AC
  Administered 2017-09-06: 30 mL
  Filled 2017-09-06: qty 30

## 2017-09-06 NOTE — ED Triage Notes (Signed)
Patient reports having left leg pain that radiates from ankle up to shoulder.  Reports saw MD for same and started steroid dose pack.

## 2017-09-06 NOTE — ED Provider Notes (Signed)
[facility] Emergency Department Provider Note  ____________________________________________   First MD Initiated Contact with Patient 09/06/17 [phone]     (approximate)  I have reviewed the triage vital signs and the nursing notes.   HISTORY  Chief Complaint Left Side Pain (ankle up to shoulder)    HPI Theresa Brock is a 62 y.o. female who comes to the emergency department with her husband with severe pain from her left knee up to her left hip.  She has a past medical history of a right sided MCA stroke causing severe left-sided deficits.  She tends to lean on her left side.  She was seen recently and had x-rays of her left knee performed which showed arthritis however her pain is inadequately controlled.  The pain today is primarily severe in the left hip.  It is sharp throbbing aching and seems to radiate up from the hip.  Nothing seems to make it better or worse.  She denies new trauma.  She has a wheelchair at baseline.    Past Medical History:  Diagnosis Date  . Colon cancer ([place])   . Diplopia 07/05/2014   Overview:  Converted from Centricity: Description - DIPLOPIA  . Dysphagia, oropharyngeal phase 09/20/2013   Overview:  Converted from Centricity: Description - DYSPHAGIA, OROPHARYNGEAL PHASE  . Essential (primary) hypertension 09/20/2013   Overview:  Converted from Centricity: Description - HYPERTENSION, BENIGN ESSENTIAL  . GERD (gastroesophageal reflux disease)   . Hyperlipidemia   . Hypertension   . Malignant neoplasm of colon ([place]) 01/23/2017  . Stroke ([place])   . Tinea unguium 09/20/2013   Overview:  Converted from Centricity: Description - ONYCHOMYCOSIS, TOENAILS    Patient Active Problem List   Diagnosis Date Noted  . 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 Malignant neoplasm of colon ([place]) 01/23/2017  . Encounter for follow-up surveillance of colon cancer 01/23/2017  . Adenocarcinoma ([place]) 06/15/2016  . Neurologic deficit due to old ischemic  stroke 06/15/2016  . Abnormal CT scan, gastrointestinal tract 05/26/2016  . Diplopia 07/05/2014  . Constipation 09/20/2013  . Dysphagia, oropharyngeal phase 09/20/2013  . Encounter for general adult medical examination without abnormal findings 09/20/2013  . Essential (primary) hypertension 09/20/2013  . 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 Hyperlipidemia 09/20/2013  . Other disorders of intestinal carbohydrate absorption 09/20/2013  . Tinea unguium 09/20/2013    Past Surgical History:  Procedure Laterality Date  . COLOSTOMY    . FLEXIBLE SIGMOIDOSCOPY N/A 05/14/2017   Procedure: FLEXIBLE SIGMOIDOSCOPY;  Surgeon: Lin Landsman, MD;  Location: [facility] ENDOSCOPY;  Service: Gastroenterology;  Laterality: N/A;  . PORT A CATH INJECTION ([place] HX) Right    7/18    Prior to Admission medications   Medication Sig Start Date End Date Taking? Authorizing Provider  acetaminophen (TYLENOL) 325 MG tablet Take 650 mg by mouth every 6 (six) hours as needed.    [provider]  atorvastatin (LIPITOR) 20 MG tablet Take 1 tablet (20 mg total) by mouth daily at 6 PM. 06/09/17 12/06/17  Trinna Post, PA-C  diclofenac sodium (VOLTAREN) 1 % GEL Apply 4 g topically 4 (four) times daily as needed (pain). 09/06/17   Darel Hong, MD  HYDROcodone-acetaminophen (NORCO) 5-325 MG tablet Take 1 tablet by mouth every 6 (six) hours as needed for up to 7 doses for severe pain. 09/06/17   Darel Hong, MD  losartan (COZAAR) 25 MG tablet Take 1 tablet (25 mg total) by mouth  daily. 06/09/17 12/06/17  Trinna Post, PA-C  Multiple Vitamin (MULTIVITAMIN) tablet Take 1 tablet by mouth daily.    [provider]    Allergies Patient has no known allergies.  Family History  Problem Relation Age of Onset  . Cancer Brother   . Stroke Mother   . Lung cancer Father     Social History Social History   Tobacco Use   . Smoking status: Never Smoker  . Smokeless tobacco: Never Used  Substance Use Topics  . Alcohol use: No  . Drug use: No    Review of Systems Constitutional: No fever/chills Eyes: No visual changes. ENT: No sore throat. Cardiovascular: Denies chest pain. Respiratory: Denies shortness of breath. Gastrointestinal: No abdominal pain.  No nausea, no vomiting.  No diarrhea.  No constipation. Genitourinary: Negative for dysuria. Musculoskeletal: Positive for hip and knee pain Skin: Negative for rash. Neurological: Negative for headaches, focal weakness or numbness.   ____________________________________________   PHYSICAL EXAM:  VITAL SIGNS: ED Triage Vitals  Enc Vitals Group     BP 09/06/17 0213 (!) 152/100     Pulse Rate 09/06/17 0213 (!) 113     Resp 09/06/17 0213 17     Temp 09/06/17 0213 97.8 F (36.6 C)     Temp Source 09/06/17 0213 Oral     SpO2 09/06/17 0213 94 %     Weight --      Height 09/06/17 0211 5' (1.524 m)     Head Circumference --      Peak Flow --      Pain Score 09/06/17 0210 7     Pain Loc --      Pain Edu? --      Excl. in [place]? --     Constitutional: Alert and oriented x4 appears obviously uncomfortable leaning onto her left side Eyes: PERRL EOMI. Head: Atraumatic. Nose: No congestion/rhinnorhea. Mouth/Throat: No trismus Neck: No stridor.   Cardiovascular: Tachycardic rate, regular rhythm. Grossly normal heart sounds.  Good peripheral circulation. Respiratory: Normal respiratory effort.  No retractions. Lungs CTAB and moving good air Gastrointestinal: Soft nontender Musculoskeletal: Left knee extremely arthritic.  Extensor mechanism intact knee stable.  Focally exquisitely tender over left trochanteric bursa Neurologic: Profound left-sided deficits Skin:  Skin is warm, dry and intact. No rash noted. Psychiatric: Mood and affect are normal. Speech and behavior are normal.    ____________________________________________   DIFFERENTIAL  includes but not limited to  [place], arthritis, septic joint ____________________________________________   LABS (all labs ordered are listed, but only abnormal results are displayed)  Labs Reviewed - No data to display   __________________________________________  EKG   ____________________________________________  RADIOLOGY  Imaging from outpatient reviewed by me shows significant arthritis in left knee ____________________________________________   PROCEDURES  Procedure(s) performed: Yes  .Joint Aspiration/Arthrocentesis Date/Time: 09/06/2017 6:52 AM Performed by: Darel Hong, MD Authorized by: Darel Hong, MD   Consent:    Consent obtained:  Verbal   Consent given by:  Patient   Risks discussed:  Bleeding, infection and pain   Alternatives discussed:  Alternative treatment Location:    Location:  Hip Anesthesia (see MAR for exact dosages):    Anesthesia method:  None Procedure details:    Needle gauge:  20 G   Approach:  Lateral   Steroid injected: yes   Comments:     I performed an injection of the left trochanteric bursa using a total of 5 cc of 0.5% bupivacaine with Kenalog in usual sterile fashion .  Joint Aspiration/Arthrocentesis Date/Time: 09/06/2017 6:52 AM Performed by: Darel Hong, MD Authorized by: Darel Hong, MD   Consent:    Consent obtained:  Verbal   Consent given by:  Patient   Risks discussed:  Bleeding, infection, nerve damage and pain   Alternatives discussed:  Alternative treatment Location:    Location:  Knee Anesthesia (see MAR for exact dosages):    Anesthesia method:  None Procedure details:    Needle gauge:  20 G   Ultrasound guidance: no     Approach:  Lateral   Aspirate amount:  0   Steroid injected: yes     Specimen collected: no   Post-procedure details:    Dressing:  Adhesive bandage Comments:     I entered the patient's left knee via lateral access in usual sterile fashion and injected a total of 10  cc of a mix of 0.5% bupivacaine with some Kenalog    Critical Care performed: no  ____________________________________________   INITIAL IMPRESSION / ASSESSMENT AND PLAN / ED COURSE  Pertinent labs & imaging results that were available during my care of the patient were reviewed by me and considered in my medical decision making (see chart for details).   As part of my medical decision making, I reviewed the following data within the [place] History obtained from family if available, nursing notes, old chart and ekg, as well as notes from prior ED visits.  The patient's clinical exam is most consistent with trochanteric bursitis and arthritis in her left knee.  Given her advanced age and difficulty ambulating I am hesitant to give her too many opioids so we discussed injection with Kenalog and bupivacaine which she consented to.  I injected both her trochanteric bursa in her left knee with improvement in her symptoms.  I will prescribe her diclofenac gel and a short course of hydrocodone for symptomatic relief.  Strict return precautions have been given.      ____________________________________________   FINAL CLINICAL IMPRESSION(S) / ED DIAGNOSES  Final diagnoses:  Trochanteric bursitis of left hip  Arthritis of left knee      NEW MEDICATIONS STARTED DURING THIS VISIT:  Discharge Medication List as of 09/06/2017  6:51 AM    START taking these medications   Details  diclofenac sodium (VOLTAREN) 1 % GEL Apply 4 g topically 4 (four) times daily as needed (pain)., Starting Sun 09/06/2017, Print    HYDROcodone-acetaminophen (NORCO) 5-325 MG tablet Take 1 tablet by mouth every 6 (six) hours as needed for up to 7 doses for severe pain., Starting Sun 09/06/2017, Print         Note:  This document was prepared using Dragon voice recognition software and may include unintentional dictation errors.     Darel Hong, MD 09/08/17 [phone]

## 2017-09-06 NOTE — Discharge Instructions (Signed)
It was a pleasure to take care of you today, and thank you for coming to our emergency department.  If you have any questions or concerns before leaving please ask the nurse to grab me and I'm more than happy to go through your aftercare instructions again.  If you were prescribed any opioid pain medication today such as Norco, Vicodin, Percocet, morphine, hydrocodone, or oxycodone please make sure you do not drive when you are taking this medication as it can alter your ability to drive safely.  If you have any concerns once you are home that you are not improving or are in fact getting worse before you can make it to your follow-up appointment, please do not hesitate to call 911 and come back for further evaluation.  Darel Hong, MD  Results for orders placed or performed in visit on 06/09/17  Comprehensive Metabolic Panel (CMET)  Result Value Ref Range   Glucose 99 65 - 99 mg/dL   BUN 17 8 - 27 mg/dL   Creatinine, Ser 0.68 0.57 - 1.00 mg/dL   GFR calc non Af Amer 93 >59 mL/min/1.73   GFR calc Af Amer 107 >59 mL/min/1.73   BUN/Creatinine Ratio 25 12 - 28   Sodium 142 134 - 144 mmol/L   Potassium 4.5 3.5 - 5.2 mmol/L   Chloride 103 96 - 106 mmol/L   CO2 23 20 - 29 mmol/L   Calcium 9.9 8.7 - 10.3 mg/dL   Total Protein 7.1 6.0 - 8.5 g/dL   Albumin 4.3 3.6 - 4.8 g/dL   Globulin, Total 2.8 1.5 - 4.5 g/dL   Albumin/Globulin Ratio 1.5 1.2 - 2.2   Bilirubin Total 0.2 0.0 - 1.2 mg/dL   Alkaline Phosphatase 163 (H) 39 - 117 IU/L   AST 20 0 - 40 IU/L   ALT 27 0 - 32 IU/L  Lipid Profile  Result Value Ref Range   Cholesterol, Total 171 100 - 199 mg/dL   Triglycerides 149 0 - 149 mg/dL   HDL 61 >39 mg/dL   VLDL Cholesterol Cal 30 5 - 40 mg/dL   LDL Calculated 80 0 - 99 mg/dL   Chol/HDL Ratio 2.8 0.0 - 4.4 ratio   Dg Knee Complete 4 Views Left  Result Date: 08/31/2017 CLINICAL DATA:  62 year old female. No injury. Pain left knee with weakness for the past week. Prior stroke 2015. Brace  not able to be removed. Initial encounter. EXAM: LEFT KNEE - COMPLETE 4+ VIEW COMPARISON:  None. FINDINGS: Moderate-to-marked medial tibiofemoral joint space narrowing. Mild patellofemoral joint degenerative changes. No fracture or dislocation. No evidence of significant joint effusion. IMPRESSION: Moderate-to-marked medial tibiofemoral joint space narrowing. Mild patellofemoral joint degenerative changes. Electronically Signed   By: Genia Del M.D.   On: 08/31/2017 19:47

## 2017-09-11 ENCOUNTER — Ambulatory Visit: Payer: BLUE CROSS/BLUE SHIELD | Admitting: Physician Assistant

## 2017-09-11 ENCOUNTER — Encounter: Payer: Self-pay | Admitting: Physician Assistant

## 2017-09-11 VITALS — BP 108/62 | HR 116 | Temp 98.4°F

## 2017-09-11 DIAGNOSIS — M13862 Other specified arthritis, left knee: Secondary | ICD-10-CM | POA: Diagnosis not present

## 2017-09-11 DIAGNOSIS — I69959 Hemiplegia and hemiparesis following unspecified cerebrovascular disease affecting unspecified side: Secondary | ICD-10-CM

## 2017-09-11 DIAGNOSIS — H9192 Unspecified hearing loss, left ear: Secondary | ICD-10-CM

## 2017-09-11 DIAGNOSIS — R350 Frequency of micturition: Secondary | ICD-10-CM

## 2017-09-11 NOTE — Patient Instructions (Signed)
Hemiparesis Hemiparesis is weakness on one side of the body, such as one arm and one leg. Hemiparesis often happens after a stroke. The weakness is usually on the side of the body that is opposite from the side of the brain that was affected by the stroke. For example, a stroke in the left side of the brain may cause hemiparesis on the right side of the body. Hemiparesis can make it hard for you to do normal daily tasks, so you may need assistive devices. Therapy can help you maintain the strength and function of the affected side of your body. What are the causes? This condition may be caused by:  Stroke.  Brain injury (trauma).  Brain tumor.  A type of dementia called mitochondrial encephalomyopathy, lactic acidosis, and stroke-like episodes (MELAS).  What are the signs or symptoms? Symptoms of this condition include:  Weakness on one side of the body.  Trouble doing daily activities such as dressing or bathing.  Loss of muscle strength on one side of the body. This may affect the arm, leg, or face.  Loss of balance.  Trouble walking.  Problems grabbing things or holding them.  Problems with coordination.  Pusher syndrome. This means you push toward the weaker side of your body and often lose your balance.  How is this diagnosed? This condition may be diagnosed based on:  A physical exam.  Your medical history.  Imaging tests, such as CT scan or MRI.  How is this treated? Treatment for this condition includes:  Physical therapy.  Occupational therapy. This therapy helps you to do basic daily activities.  Exercises done in a pool (hydrotherapy).  Cortical stimulation therapy. This involves stimulating your brain with electrical currents to make it work better.  Modified constraint-induced movement therapy (mCIMT). This therapy restricts one side of your body so that you have to use the weaker side.  Electrical stimulation. This involves stimulating the muscles  on the weaker side of your body with small electrical pads.  Medicines that help stiffness and discomfort.  Using devices to help you move around (assistive devices), such as canes, walkers, or wheelchairs.  Follow these instructions at home: Safety  Use assistive devices as instructed. Your risk of falling is higher because of hemiparesis.  Make changes in your home to lower your risk of falls. These may include: ? Removing rugs and mats from the floor. ? Removing clutter. ? Adding rails or grab bars in bathrooms. Activity  Do not drive unless your health care provider approves.  Do not drive or use heavy machinery while taking medicine that alters your alertness or reaction time.  Return to your normal activities as told by your health care provider. Ask your health care provider what activities are safe for you.  Ask your health care provider about getting extra help at home. You may have problems doing your normal activities, such as dressing, bathing, using the bathroom, and eating. Lifestyle  Do not use any products that contain nicotine or tobacco, such as cigarettes and e-cigarettes. If you need help quitting, ask your health care provider.  Avoid drinking alcohol. General instructions  Take over-the-counter and prescription medicines only as told by your health care provider.  Wear clothes and shoes that are easy to put on and take off. Try to avoid zippers and buttons.  Keep all follow-up visits as told by your health care provider. This is important. Contact a health care provider if:  Your symptoms get worse and medicines do   not help.  You have severe pain. Get help right away if:  You have jerky movements that you cannot control (seizure).  You have trouble breathing.  You have chest pain. Summary  Hemiparesis is weakness on one side of the body, such as one arm and one leg. This often happens after a stroke or brain injury.  Treatment usually includes  physical and occupational therapy. Occupational therapy can help you with everyday tasks that may be difficult, such as dressing, using the bathroom, and eating.  Your risk of falling is higher because of hemiparesis. You may need to use a wheelchair, walker, or another assistive device. Make changes in your home to prevent falls, such as removing clutter and installing grab bars. This information is not intended to replace advice given to you by your health care provider. Make sure you discuss any questions you have with your health care provider. Document Released: 05/07/2016 Document Revised: 05/07/2016 Document Reviewed: 05/07/2016 Elsevier Interactive Patient Education  2018 [employer]  

## 2017-09-11 NOTE — Progress Notes (Signed)
Patient: Theresa Brock Female    DOB: 07/22/1955   62 y.o.   MRN: [id number] Visit Date: 09/11/2017  Today's Provider: Trinna Post, PA-C   Chief Complaint  Patient presents with  . Follow-up    ER follow up from 09/06/2017  . Knee Pain    Left  . Hip Pain    Left   Subjective:   PMH significant for stroke in 2015 causing left sided hemiparesis and colon cancer requiring total colectomy and ileostomy. She has had knee pain x 2 weeks as well as hip pain. Left knee xray on 08/31/2017 showed moderate arthritis. She was in the ER on 09/09/2017 and got a trochanteric injection as well as left sided knee steroid injection. She thinks this may have helped slightly along with anti-inflammatories. Husband reports she has been walking quite poorly, compensating with her left leg inappropriately which he thinks is causing her pain. She wears a knee brace to compensate for left sided foot drop.  She also has some urinary hesitancy, wondering about UTI.   Patient endorses hearing deficit in left ear.   Knee Pain   There was no injury mechanism. The pain is present in the left knee and left hip. Associated symptoms include an inability to bear weight and a loss of sensation.  Hip Pain   The incident occurred more than 1 week ago. There was no injury mechanism. The pain is present in the left hip. Associated symptoms include an inability to bear weight and a loss of sensation.  Urinary Tract Infection   This is a new problem. The current episode started in the past 7 days. The problem has been unchanged. There has been no fever. Associated symptoms include hesitancy. Pertinent negatives include no chills, discharge, flank pain, frequency, hematuria, nausea, possible pregnancy, sweats, urgency or vomiting.   BP Readings from Last 3 Encounters:  09/11/17 108/62  09/06/17 124/81  08/31/17 128/70   Wt Readings from Last 3 Encounters:  08/31/17 157 lb 3.2 oz (71.3 kg)  06/09/17 152 lb (68.9 kg)   06/04/17 150 lb (68 kg)        No Known Allergies   Current Outpatient Medications:  .  acetaminophen (TYLENOL) 325 MG tablet, Take 650 mg by mouth every 6 (six) hours as needed., Disp: , Rfl:  .  atorvastatin (LIPITOR) 20 MG tablet, Take 1 tablet (20 mg total) by mouth daily at 6 PM., Disp: 90 tablet, Rfl: 1 .  diclofenac sodium (VOLTAREN) 1 % GEL, Apply 4 g topically 4 (four) times daily as needed (pain)., Disp: 100 g, Rfl: 0 .  ibuprofen (ADVIL,MOTRIN) 600 MG tablet, Take 600 mg by mouth every 6 (six) hours as needed., Disp: , Rfl:  .  losartan (COZAAR) 25 MG tablet, Take 1 tablet (25 mg total) by mouth daily., Disp: 90 tablet, Rfl: 1 .  HYDROcodone-acetaminophen (NORCO) 5-325 MG tablet, Take 1 tablet by mouth every 6 (six) hours as needed for up to 7 doses for severe pain. (Patient not taking: Reported on 09/11/2017), Disp: 7 tablet, Rfl: 0 .  Multiple Vitamin (MULTIVITAMIN) tablet, Take 1 tablet by mouth daily., Disp: , Rfl:   Review of Systems  Constitutional: Negative.  Negative for chills.  Gastrointestinal: Negative for nausea and vomiting.  Genitourinary: Positive for hesitancy. Negative for flank pain, frequency, hematuria and urgency.  Musculoskeletal: Positive for arthralgias, back pain, gait problem and joint swelling. Negative for myalgias, neck pain and neck stiffness.  Neurological: Negative  for dizziness, light-headedness and headaches.    Social History   Tobacco Use  . Smoking status: Never Smoker  . Smokeless tobacco: Never Used  Substance Use Topics  . Alcohol use: No   Objective:   BP 108/62 (BP Location: Right Arm, Patient Position: Sitting, Cuff Size: Normal)   Pulse (!) 116   Temp 98.4 F (36.9 C) (Oral)   SpO2 96%  Vitals:   09/11/17 1139  BP: 108/62  Pulse: (!) 116  Temp: 98.4 F (36.9 C)  TempSrc: Oral  SpO2: 96%     Physical Exam  Constitutional: She is oriented to person, place, and time. She appears well-developed and well-nourished.   Cardiovascular: Regular rhythm. Tachycardia present.  Pulmonary/Chest: Effort normal and breath sounds normal.  Musculoskeletal: She exhibits no edema or deformity.  Neurological: She is alert and oriented to person, place, and time.  Skin: Skin is warm and dry.  Psychiatric: She has a normal mood and affect. Her behavior is normal.        Assessment & Plan:  1. 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 Will refer to PT for gait retraining. Also had conversation about future plans as patient and her husband currently live in an RV where patient's mobility is limited. Husband is full time care taker. Encouraged them to think about long term care facilities for future care needs in anticipation of potential decline in this patient.   - Ambulatory referral to Physical Therapy - Ambulatory referral to Orthopedics  2. Allergic arthritis of left knee  - Ambulatory referral to Orthopedics  3. Hearing loss of left ear, unspecified hearing loss type  - Ambulatory referral to ENT  4. Urinary frequency  Forgot to give patient urine cup to take home as she could not provide sample in office. They may pick this up at 9:00 AM at Saturday clinic or Monday and drop off specimen.   I have spent 25 minutes with this patient, >50% of which was spent on counseling and coordination of care.  Return if symptoms worsen or fail to improve.  The entirety of the information documented in the History of Present Illness, Review of Systems and Physical Exam were personally obtained by me. Portions of this information were initially documented by Ashley Royalty, CMA and reviewed by me for thoroughness and accuracy.         Trinna Post, PA-C  [facility]

## 2017-10-01 ENCOUNTER — Ambulatory Visit: Payer: BLUE CROSS/BLUE SHIELD | Attending: Physician Assistant

## 2017-10-01 ENCOUNTER — Other Ambulatory Visit: Payer: BLUE CROSS/BLUE SHIELD

## 2017-10-01 ENCOUNTER — Ambulatory Visit: Payer: BLUE CROSS/BLUE SHIELD | Admitting: Oncology

## 2017-10-01 VITALS — BP 129/81 | HR 107

## 2017-10-01 DIAGNOSIS — M6281 Muscle weakness (generalized): Secondary | ICD-10-CM | POA: Insufficient documentation

## 2017-10-01 DIAGNOSIS — R2681 Unsteadiness on feet: Secondary | ICD-10-CM | POA: Diagnosis present

## 2017-10-01 NOTE — Therapy (Signed)
[place] MAIN [facility] SERVICES [address] [place], [place], [address] Phone: [phone]   Fax:  [phone]  Physical Therapy Evaluation  Patient Details  Name: Theresa Brock MRN: [id number] Date of Birth: 07-Mar-1955 Referring Provider: Dr. Terrilee Croak   Encounter Date: 10/01/2017  PT End of Session - 10/01/17 1423    Visit Number  1    Number of Visits  25    Date for PT Re-Evaluation  12/24/17    Authorization Type  Progress note: 1/10    Authorization Time Period  Last goals: 10/01/17    PT Start Time  0930    PT Stop Time  1030    PT Time Calculation (min)  60 min    Equipment Utilized During Treatment  Gait belt    Activity Tolerance  Patient tolerated treatment well    Behavior During Therapy  [facility] for tasks assessed/performed       Past Medical History:  Diagnosis Date  . Colon cancer ([place])   . Diplopia 07/05/2014   Overview:  Converted from [facility]: Description - DIPLOPIA  . Dysphagia, oropharyngeal phase 09/20/2013   Overview:  Converted from [facility]: Description - DYSPHAGIA, OROPHARYNGEAL PHASE  . Essential (primary) hypertension 09/20/2013   Overview:  Converted from [facility]ricity: Description - HYPERTENSION, BENIGN ESSENTIAL  . GERD (gastroesophageal reflux disease)   . Hyperlipidemia   . Hypertension   . Malignant neoplasm of colon ([place]) 01/23/2017  . Stroke ([place])   . Tinea unguium 09/20/2013   Overview:  Converted from [facility]ric[facility]: Description - ONYCHOMYCOSIS, TOENAILS    Past Surgical History:  Procedure Laterality Date  . COLOSTOMY    . FLEXIBLE SIGMOIDOSCOPY N/A 05/14/2017   Procedure: FLEXIBLE SIGMOIDOSCOPY;  Surgeon: Lin Landsman, MD;  Location: [facility] ENDOSCOPY;  Service: Gastroenterology;  Laterality: N/A;  . PORT A CATH INJECTION ([place] [facility]) Right    7/18    Vitals:   10/01/17 0943  BP: 129/81  Pulse: (!) 107  SpO2: 100%     Subjective Assessment - 10/01/17 1058    Subjective  Imbalance    Patient  is accompained by:  --   Husband   Pertinent History  Pt sufferred a CVA on 2015. Husband reports it was a "brain bleed" on the right side of her brain "in the middle." She has not had any further imaging of her brain since that stroke. Pt suffered from LUE/LLE weakness after the CVA, upper extremity more affected than lower extremity. Pt worked with physical therapy after her CVA but still had profound hemiparesis. She underwent colon cancer surgery May 2018. She currently has a colostomy bag. Pt was in the hospital for 1 months and then spent an additional month in rehab. Following surgery she underwent chemotherapy from September through November 2018. Pt became very weak and sick from the chemotherapy. She reports progressive weakness since her colon surgery. She has not had any therapy since November 2018. Husband reports gradual and progressive worsening of ambulation. Husband guards her with ambulation and she is currently using a quad cane. They live in an RV and pt is unable to enter/exit steps to RV without significant assist from husband. Husband reports that when entering/exiting RV with patient he does approximately "60% of the work." No falls in the last 6 months. Pt wears a hinged L metal AFO since 2016. She tried a plastic AFO initially but it did not provide enough ankle stability. She is concerned that it might not currently be fitting right.  No reported recent changes in her health/medications. She does endorse some L knee pain which she is concerned may limit her ability to strengthen. No scheduled follow-up with MD. No red flags currently with the exception of personal history of cancer.     Limitations  Walking    Diagnostic tests  No recent imaging reported    Patient Stated Goals  "I want to get stronger." Pt would like to enter/exit RV with more ease and improve her ability to ambulate    Currently in Pain?  No/denies   Chronic L knee pain, not aggravated in sitting at this time        OBJECTIVE  MUSCULOSKELETAL: Tremor: Absent Bulk: Decreased LLE bulk Tone: 2+ L elbow extensor tone, flaccid LUE with mild L humeral instability with distraction  Posture Flaccid LUE with spastic L grip posturing  Gait AFO donned on LLE. Step-to pattern with quad cane. Instability grossly obvious. Excessive L knee flexion during pre-contact and midstance secondary to what looks like an attempt to prevent hyperextension of knee. Decreased speed  Strength R/L 5/3 Hip flexion 5/3+ Hip external rotation 5/3+ Hip internal rotation 4/3 Hip abduction (in sitting) 4/3 Hip adduction (in sitting) 5/4- Knee extension 5/4- Knee flexion 5/3+ Ankle Dorsiflexion Minimal L ankle plantarflexion strength noted in sitting;  Hemiparetic LUE with no active motion of elbow flexion/extension. Minimal L finger extension noted with L hand in a spastic flexed grip. 2+ tone in L elbow extension. Minimal active L shoulder shrug  AROM R/L 180/120* Shoulder Flexion 150/100* Shoulder Abduction Full L knee flexion/extension present Spring loaded hinged AFO makes it difficult to accurately assess dorsiflexion ROM at this time *Indicates Pain  NEUROLOGICAL:  Mental Status Patient is oriented to person, place and time.  Recent memory is impaired per husband and pt report  Remote memory is intact per patient report Attention span and concentration are intact.  Expressive speech is intact.  Patient's fund of knowledge is within normal limits for educational level.  Cranial Nerves Visual acuity and visual fields are intact  Extraocular muscles are intact but upward vertical gaze appears diminished bilaterally Facial sensation diminished on L side L side facial droop Hearing is normal as tested by gross conversation however pt reports hearing loss and is being fitted for hearing aides currently Palate elevates midline, Mild slurring of speech Shoulder shrug strength is normal on the right but  diminished on the left side Tongue protrudes Left   Sensation Diminished light touch LUE/LLE as determined by testing dermatomes C2-T2/L2-S2 respectively. Pt reports diminished sensation in L trunk. L face sensation is deminished as well Proprioception and hot/cold testing deferred on this date  Reflexes Reflex testing deferred on this date;  Coordination/Cerebellar Coordination testing deferred on this date;  FUNCTIONAL OUTCOME MEASURES   Results Comments  BERG 19/56 Fall risk, in need of intervention  DGI    FGA    TUG 57.4 seconds with quad cane from wheelchair Fall risk, in need of intervention  5TSTS Unable to perform sit to stand without UE support Fall risk, in need of intervention  10 Meter Gait Speed Self-selected: 63.53s = 0.16 m/s;  Below normative values for full community ambulation  ABC Scale 18.75% Low balance confidence  DHI    6 Minute Walk Test    mCTSIB           [facility] PT Assessment - 10/01/17 1020      Assessment   Medical Diagnosis  Gait difficulty    Referring  Provider  Dr. Terrilee Croak    Onset Date/Surgical Date  06/03/16   Approximate   Hand Dominance  Right    Next MD Visit  None scheduled    Prior Therapy  Previously      Precautions   Precautions  Fall      Restrictions   Weight Bearing Restrictions  No      Balance Screen   Has the patient fallen in the past 6 months  No    Has the patient had a decrease in activity level because of a fear of falling?   No    Is the patient reluctant to leave their home because of a fear of falling?   No      Home Environment   Living Environment  Other (Comment)    Additional Comments  Currently lives in RV, 5 steps to enter with hand rail. Requires assist from husband with enter/exit      Prior Function   Level of Independence  Needs assistance with ADLs    Leisure  Reading, watching TV      Cognition   Overall Cognitive Status  Within Functional Limits for tasks assessed   See evaluation      Standardized Balance Assessment   Standardized Balance Assessment  Berg Balance Test;Timed Up and Go Test;10 meter walk test;Five Times Sit to Stand    Five times sit to stand comments   Unable to stand without significant UE support    10 Meter Walk  Self-selected: 63.53s = 0.16 m/s;       Berg Balance Test   Sit to Stand  Able to stand using hands after several tries    Standing Unsupported  Able to stand 2 minutes with supervision    Sitting with Back Unsupported but Feet Supported on Floor or Stool  Able to sit safely and securely 2 minutes    Stand to Sit  Uses backs of legs against chair to control descent    Transfers  Needs one person to assist    Standing Unsupported with Eyes Closed  Needs help to keep from falling    Standing Ubsupported with Feet Together  Needs help to attain position but able to stand for 30 seconds with feet together    From Standing, Reach Forward with Outstretched Arm  Can reach forward >5 cm safely (2")    From Standing Position, Pick up Object from Floor  Unable to try/needs assist to keep balance    From Standing Position, Turn to Look Behind Over each Shoulder  Needs assist to keep from losing balance and falling    Turn 360 Degrees  Needs assistance while turning    Standing Unsupported, Alternately Place Feet on Step/Stool  Needs assistance to keep from falling or unable to try    Standing Unsupported, One Foot in Front  Able to plae foot ahead of the other independently and hold 30 seconds    Standing on One Leg  Tries to lift leg/unable to hold 3 seconds but remains standing independently    Total Score  19      Timed Up and Go Test   TUG  Normal TUG    Normal TUG (seconds)  57.4   From wheelchair               Objective measurements completed on examination: See above findings.              PT Education - 10/01/17 1423  Education Details  outcome measures and plan of care    Person(s) Educated  Patient;Spouse     Methods  Explanation    Comprehension  Verbalized understanding       PT Short Term Goals - 10/01/17 1055      PT SHORT TERM GOAL #1   Title  Pt will be independent with HEP in order to improve strength and balance in order to decrease fall risk and improve function at home and work.     Time  6    Period  Weeks    Status  New    Target Date  11/12/17        PT Long Term Goals - 10/01/17 1055      PT LONG TERM GOAL #1   Title  Pt will improve BERG by at least 3 points in order to demonstrate clinically significant improvement in balance.      Baseline  10/01/17: 19/56    Time  12    Period  Weeks    Status  New    Target Date  12/24/17      PT LONG TERM GOAL #2   Title  Pt will improve ABC by at least 13% in order to demonstrate clinically significant improvement in balance confidence.     Baseline  10/01/17: 18.75%    Time  12    Period  Weeks    Status  New    Target Date  12/24/17      PT LONG TERM GOAL #3   Title  Pt will decrease TUG by 23% (13.2s) in order to demonstrate decreased fall risk.    Baseline  10/01/17: 57.4s    Time  12    Period  Weeks    Status  New    Target Date  12/24/17      PT LONG TERM GOAL #4   Title  Pt will increase 10MWT by at least 0.13 m/s in order to demonstrate clinically significant improvement in community ambulation.     Baseline  10/01/17: Self-selected: 63.53s = 0.16 m/s;     Time  12    Period  Weeks    Status  New    Target Date  12/24/17             Plan - 10/01/17 1424    Clinical Impression Statement  Pt is a pleasant 62 yo female referred for gait training. Pt sufferred a CVA on 2015 with LUE/LLE hemparesis. She underwent colon cancer surgery May 2018 followed by chemotherapy from September through November. She reports progressive weakness since her colon surgery and would like to improve her strength and ambulation. PT evaluation reveals grossly hemiparetic LUE with significantly weak LLE. AFO donned on LLE for  ambulation. Pt with step-to pattern using a quad cane. Instability grossly obvious. Excessive L knee flexion during pre-contact and midstance secondary to what looks like an attempt to prevent hyperextension of knee. Gait speed is very slow at 0.16 m/s over 10 meters. She has significant balance impairments with BERG of 19/56 and TUG of 57.4 seconds while using a quad cane. She is a very high fall risk. She will benefit from skilled PT services to address deficits in gait, balance, and strength in order decrease fall risk and improve her function.     History and Personal Factors relevant to plan of care:  2 personal factors/comorbidities, 3 body systems/activity limitations/participation restrictions      Clinical Presentation  Evolving  Clinical Presentation due to:  Pt reports worsening of symptoms    Clinical Decision Making  Moderate    Rehab Potential  Fair    Clinical Impairments Affecting Rehab Potential  Positive: motivation; Negative: chronicity    PT Frequency  2x / week    PT Duration  12 weeks    PT Treatment/Interventions  ADLs/Self Care Home Management;Aquatic Therapy;Biofeedback;Canalith Repostioning;Cryotherapy;Electrical Stimulation;Iontophoresis 4mg /ml Dexamethasone;Moist Heat;Traction;Ultrasound;DME Instruction;Gait training;Functional mobility training;Therapeutic activities;Therapeutic exercise;Balance training;Neuromuscular re-education;Patient/family education;Manual techniques;Wheelchair mobility training;Dry needling;Energy conservation;Vestibular    PT Next Visit Plan  Initiate HEP, progress balance and strenghtening, eventually consider bodyweight support treadmill    PT Home Exercise Plan  None currentl    Consulted and Agree with Plan of Care  Patient;Family member/caregiver    Family Member Consulted  Husband       Patient will benefit from skilled therapeutic intervention in order to improve the following deficits and impairments:  Abnormal gait, Decreased activity  tolerance, Decreased balance, Decreased mobility, Decreased strength, Difficulty walking  Visit Diagnosis: Unsteadiness on feet - Plan: PT plan of care cert/re-cert  Muscle weakness (generalized) - Plan: PT plan of care cert/re-cert     Problem List Patient Active Problem List   Diagnosis Date Noted  . 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 Malignant neoplasm of colon ([place]) 01/23/2017  . Encounter for follow-up surveillance of colon cancer 01/23/2017  . Adenocarcinoma ([place]) 06/15/2016  . 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 Abnormal CT scan, gastrointestinal tract 05/26/2016  . Diplopia 07/05/2014  . Constipation 09/20/2013  . Dysphagia, oropharyngeal phase 09/20/2013  . Encounter for general adult medical examination without abnormal findings 09/20/2013  . Essential (primary) hypertension 09/20/2013  . 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 Hyperlipidemia 09/20/2013  . Other disorders of intestinal carbohydrate absorption 09/20/2013  . Tinea unguium 09/20/2013   Phillips Grout PT, DPT, GCS  Huprich,Jason 10/01/2017, 3:21 PM  [place] MAIN [facility] SERVICES [address] [place], [place], [address] Phone: [phone]   Fax:  [phone]  Name: Theresa Brock MRN: [id number] Date of Birth: 1955/12/14

## 2017-10-02 ENCOUNTER — Other Ambulatory Visit: Payer: Self-pay

## 2017-10-02 ENCOUNTER — Inpatient Hospital Stay: Payer: BLUE CROSS/BLUE SHIELD | Attending: Oncology

## 2017-10-02 ENCOUNTER — Ambulatory Visit
Admission: RE | Admit: 2017-10-02 | Discharge: 2017-10-02 | Disposition: A | Discharge status: Home / Self Care | Payer: BLUE CROSS/BLUE SHIELD | Source: Ambulatory Visit | Attending: Oncology | Admitting: Oncology

## 2017-10-02 DIAGNOSIS — Z9221 Personal history of antineoplastic chemotherapy: Secondary | ICD-10-CM | POA: Insufficient documentation

## 2017-10-02 DIAGNOSIS — Z85038 Personal history of other malignant neoplasm of large intestine: Secondary | ICD-10-CM | POA: Diagnosis present

## 2017-10-02 DIAGNOSIS — I2510 Atherosclerotic heart disease of native coronary artery without angina pectoris: Secondary | ICD-10-CM | POA: Insufficient documentation

## 2017-10-02 DIAGNOSIS — Z9049 Acquired absence of other specified parts of digestive tract: Secondary | ICD-10-CM | POA: Insufficient documentation

## 2017-10-02 DIAGNOSIS — C189 Malignant neoplasm of colon, unspecified: Secondary | ICD-10-CM

## 2017-10-02 DIAGNOSIS — M533 Sacrococcygeal disorders, not elsewhere classified: Secondary | ICD-10-CM | POA: Insufficient documentation

## 2017-10-02 DIAGNOSIS — K760 Fatty (change of) liver, not elsewhere classified: Secondary | ICD-10-CM | POA: Diagnosis not present

## 2017-10-02 LAB — CBC WITH DIFFERENTIAL/PLATELET
Basophils Absolute: 0.1 10*3/uL (ref 0–0.1)
Basophils Relative: 1 %
Eosinophils Absolute: 0.3 10*3/uL (ref 0–0.7)
Eosinophils Relative: 4 %
HCT: 36.7 % (ref 35.0–47.0)
Hemoglobin: 12.7 g/dL (ref 12.0–16.0)
LYMPHS ABS: 2 10*3/uL (ref 1.0–3.6)
Lymphocytes Relative: 24 %
MCH: 32.9 pg (ref 26.0–34.0)
MCHC: 34.5 g/dL (ref 32.0–36.0)
MCV: 95.4 fL (ref 80.0–100.0)
MONO ABS: 0.7 10*3/uL (ref 0.2–0.9)
Monocytes Relative: 8 %
NEUTROS ABS: 5.2 10*3/uL (ref 1.4–6.5)
Neutrophils Relative %: 63 %
PLATELETS: 412 10*3/uL (ref 150–440)
RBC: 3.85 MIL/uL (ref 3.80–5.20)
RDW: 13.1 % (ref 11.5–14.5)
WBC: 8.2 10*3/uL (ref 3.6–11.0)

## 2017-10-02 LAB — COMPREHENSIVE METABOLIC PANEL
ALBUMIN: 3.6 g/dL (ref 3.5–5.0)
ALK PHOS: 141 U/L — AB (ref 38–126)
ALT: 30 U/L (ref 0–44)
ANION GAP: 12 (ref 5–15)
AST: 37 U/L (ref 15–41)
BUN: 11 mg/dL (ref 8–23)
CHLORIDE: 105 mmol/L (ref 98–111)
CO2: 23 mmol/L (ref 22–32)
CREATININE: 0.77 mg/dL (ref 0.44–1.00)
Calcium: 9.4 mg/dL (ref 8.9–10.3)
GFR calc non Af Amer: >60 mL/min (ref >60)
GLUCOSE: 131 mg/dL — AB (ref 70–99)
Potassium: 3.7 mmol/L (ref 3.5–5.1)
SODIUM: 140 mmol/L (ref 135–145)
Total Bilirubin: 0.5 mg/dL (ref 0.3–1.2)
Total Protein: 7.2 g/dL (ref 6.5–8.1)

## 2017-10-02 MED ORDER — IOPAMIDOL (ISOVUE-300) INJECTION 61%
100.0000 mL | Freq: Once | INTRAVENOUS | Status: AC | PRN
  Administered 2017-10-02: 100 mL via INTRAVENOUS

## 2017-10-03 LAB — CEA: CEA: 3.7 ng/mL (ref 0.0–4.7)

## 2017-10-06 ENCOUNTER — Ambulatory Visit: Payer: BLUE CROSS/BLUE SHIELD | Attending: Physician Assistant | Admitting: Physical Therapy

## 2017-10-06 DIAGNOSIS — R2681 Unsteadiness on feet: Secondary | ICD-10-CM

## 2017-10-06 DIAGNOSIS — M6281 Muscle weakness (generalized): Secondary | ICD-10-CM | POA: Diagnosis present

## 2017-10-06 NOTE — Therapy (Signed)
[place] MAIN [facility] SERVICES [address] [place], [place], [address] Phone: [phone]   Fax:  [phone]  Physical Therapy Treatment  Patient Details  Name: Theresa Brock MRN: [id number] Date of Birth: 1955/06/02 Referring Provider: Dr. Terrilee Croak   Encounter Date: 10/06/2017  PT End of Session - 10/06/17 0910    Visit Number  2    Number of Visits  25    Date for PT Re-Evaluation  12/24/17    Authorization Type  Progress note: 1/10    Authorization Time Period  Last goals: 10/01/17    PT Start Time  0900    PT Stop Time  0940    PT Time Calculation (min)  40 min       Past Medical History:  Diagnosis Date  . Colon cancer ([place])   . Diplopia 07/05/2014   Overview:  Converted from [employer]: Description - DIPLOPIA  . Dysphagia, oropharyngeal phase 09/20/2013   Overview:  Converted from [employer]: Description - DYSPHAGIA, OROPHARYNGEAL PHASE  . Essential (primary) hypertension 09/20/2013   Overview:  Converted from [employer]: Description - HYPERTENSION, BENIGN ESSENTIAL  . GERD (gastroesophageal reflux disease)   . Hyperlipidemia   . Hypertension   . Malignant neoplasm of colon ([place]) 01/23/2017  . Stroke ([place])   . Tinea unguium 09/20/2013   Overview:  Converted from [employer]: Description - ONYCHOMYCOSIS, TOENAILS    Past Surgical History:  Procedure Laterality Date  . COLOSTOMY    . FLEXIBLE SIGMOIDOSCOPY N/A 05/14/2017   Procedure: FLEXIBLE SIGMOIDOSCOPY;  Surgeon: Lin Landsman, MD;  Location: [facility] ENDOSCOPY;  Service: Gastroenterology;  Laterality: N/A;  . PORT A CATH INJECTION ([place] HX) Right    7/18    There were no vitals filed for this visit.  Subjective Assessment - 10/06/17 0908    Subjective  Pt arrives in good spirits this morning. Pt reports continued 8/10 pain in left knee. No major updates since last session (evaluation visit).     Pertinent History  Pt sufferred a CVA on 2015. Husband reports it was a "brain  bleed" on the right side of her brain "in the middle." She has not had any further imaging of her brain since that stroke. Pt suffered from LUE/LLE weakness after the CVA, upper extremity more affected than lower extremity. Pt worked with physical therapy after her CVA but still had profound hemiparesis. She underwent colon cancer surgery May 2018. She currently has a colostomy bag. Pt was in the hospital for 1 months and then spent an additional month in rehab. Following surgery she underwent chemotherapy from September through November 2018. Pt became very weak and sick from the chemotherapy. She reports progressive weakness since her colon surgery. She has not had any therapy since November 2018. Husband reports gradual and progressive worsening of ambulation. Husband guards her with ambulation and she is currently using a quad cane. They live in an RV and pt is unable to enter/exit steps to RV without significant assist from husband. Husband reports that when entering/exiting RV with patient he does approximately "60% of the work." No falls in the last 6 months. Pt wears a hinged L metal AFO since 2016. She tried a plastic AFO initially but it did not provide enough ankle stability. She is concerned that it might not currently be fitting right. No reported recent changes in her health/medications. She does endorse some L knee pain which she is concerned may limit her ability to strengthen. No scheduled follow-up with MD.  No red flags currently with the exception of personal history of cancer.     Patient Stated Goals  "I want to get stronger." Pt would like to enter/exit RV with more ease and improve her ability to ambulate    Currently in Pain?  Yes    Pain Score  8     Pain Location  --   Left knee   Pain Orientation  Left       Treatment This Date:  -NuStep s LUE use, level 1, 5 minutes ad lib, monitored to assure no aggravation of Left knee pain or aberrant movement of left knee. Pt takes 2 rest  breaks ad lib to recover from SOB.  -STS transfers from [facility] x3 (MinA) -Stand pivot transfer x3; (minA)  -STS transfer from [facility] + pillow x2, CGA -STS transfer from [facility] + airex pad @ supervision level: 8x with RUE on arm rest, and 2x with RUE pushing up from Hemiwalker -FWD ambulation training c hemiwalker: 1x69ft, 1x61ft CGA, tactile cues for HW placement.  -Seated LLE marching, AA/ROM for full available joint range: 10x -Seated LLE marching, AA/ROM LAQ (heavy VC required to perform correctly, potentially limited by focal weakness)      PT Short Term Goals - 10/06/17 1001      PT SHORT TERM GOAL #1   Title  Pt will be independent with HEP in order to improve strength and balance in order to decrease fall risk and improve function at home and work.     Time  6    Period  Weeks    Status  On-going    Target Date  11/12/17        PT Long Term Goals - 10/06/17 1008      PT LONG TERM GOAL #1   Title  Pt will improve BERG by at least 3 points in order to demonstrate clinically significant improvement in balance.      Baseline  10/01/17: 19/56    Time  12    Period  Weeks    Status  On-going    Target Date  12/24/17      PT LONG TERM GOAL #2   Title  Pt will improve ABC by at least 13% in order to demonstrate clinically significant improvement in balance confidence.     Baseline  10/01/17: 18.75%    Time  12    Period  Weeks    Status  On-going    Target Date  12/24/17      PT LONG TERM GOAL #3   Title  Pt will decrease TUG by 23% (13.2s) in order to demonstrate decreased fall risk.    Baseline  10/01/17: 57.4s    Time  12    Period  Weeks    Status  On-going    Target Date  12/24/17      PT LONG TERM GOAL #4   Title  Pt will increase 10MWT by at least 0.13 m/s in order to demonstrate clinically significant improvement in community ambulation.     Baseline  10/01/17: Self-selected: 63.53s = 0.16 m/s;     Time  12    Period  Weeks    Status  On-going    Target Date  12/24/17             Plan - 10/06/17 0912    Clinical Impression Statement  Reviewed treatment goals and evaluation findings from first visit. Performed a trial of NuStep, as well as exercises  to add in for HEP. Pt with noted sOB during nustep and required rest breaks. WC seat height adjusted for home based STS transfers with lower intensity adn higher volume. Pt demonstrates improved technique, confidence, and motor planning to achieve transfer to standing. Also reversed Right WC handle for more anterior surface availablility. Trained patient to use hemiwalker for transfers, standing balance, and short distance AMB, which she adapted to well, adn agreed that shhe felt more stable than QC use: discussed consideration of transition to [facility] if successful over a few visits. Pt given STS, seated marching, and LAQ for HEP. Husband educated on providing adequate rest breaks, but also providing light physical assist for full available ROM.     Rehab Potential  Fair    Clinical Impairments Affecting Rehab Potential  Positive: motivation; Negative: chronicity    PT Frequency  2x / week    PT Duration  12 weeks    PT Treatment/Interventions  ADLs/Self Care Home Management;Aquatic Therapy;Biofeedback;Canalith Repostioning;Cryotherapy;Electrical Stimulation;Iontophoresis 4mg /ml Dexamethasone;Moist Heat;Traction;Ultrasound;DME Instruction;Gait training;Functional mobility training;Therapeutic activities;Therapeutic exercise;Balance training;Neuromuscular re-education;Patient/family education;Manual techniques;Wheelchair mobility training;Dry needling;Energy conservation;Vestibular    PT Next Visit Plan  review HEP, progress balance and strenghtening, continue low resistance NUStep breaks as needed, eventually consider bodyweight support treadmill    PT Home Exercise Plan  STS from [facility] (elevated seat surface), Seated LLE LAQ, Seated LLE marching.     Consulted and Agree with Plan of Care  Patient;Family member/caregiver     Family Member Consulted  Husband       Patient will benefit from skilled therapeutic intervention in order to improve the following deficits and impairments:  Abnormal gait, Decreased activity tolerance, Decreased balance, Decreased mobility, Decreased strength, Difficulty walking  Visit Diagnosis: Unsteadiness on feet  Muscle weakness (generalized)     Problem List Patient Active Problem List   Diagnosis Date Noted  . 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 Malignant neoplasm of colon ([place]) 01/23/2017  . Encounter for follow-up surveillance of colon cancer 01/23/2017  . Adenocarcinoma ([place]) 06/15/2016  . 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 Abnormal CT scan, gastrointestinal tract 05/26/2016  . Diplopia 07/05/2014  . Constipation 09/20/2013  . Dysphagia, oropharyngeal phase 09/20/2013  . Encounter for general adult medical examination without abnormal findings 09/20/2013  . Essential (primary) hypertension 09/20/2013  . Hemiplegia and hemiparesis following unspecified cerebrovascular disease affecting unspecified side ([place]) 09/20/2013  . Hyperlipidemia 09/20/2013  . Other disorders of intestinal carbohydrate absorption 09/20/2013  . Tinea unguium 09/20/2013   10:24 AM, 10/06/17 Etta Grandchild, PT, DPT Physical Therapist - [facility]  Outpatient Physical Therapy- [place] [phone]    Etta Grandchild 10/06/2017, 10:19 AM  [place] MAIN [facility] SERVICES [address] [place], [place], [address] Phone: [phone]   Fax:  [phone]  Name: Dolce Sylvia MRN: [id number] Date of Birth: 08-Feb-1955

## 2017-10-08 ENCOUNTER — Ambulatory Visit
Admission: RE | Admit: 2017-10-08 | Discharge: 2017-10-08 | Disposition: A | Discharge status: Home / Self Care | Payer: BLUE CROSS/BLUE SHIELD | Source: Ambulatory Visit | Attending: Radiation Oncology | Admitting: Radiation Oncology

## 2017-10-08 ENCOUNTER — Encounter: Payer: Self-pay | Admitting: Oncology

## 2017-10-08 ENCOUNTER — Inpatient Hospital Stay: Payer: BLUE CROSS/BLUE SHIELD | Attending: Oncology | Admitting: Oncology

## 2017-10-08 ENCOUNTER — Ambulatory Visit: Payer: BLUE CROSS/BLUE SHIELD | Admitting: Physical Therapy

## 2017-10-08 ENCOUNTER — Encounter: Payer: Self-pay | Admitting: *Deleted

## 2017-10-08 VITALS — BP 125/89 | HR 118 | Temp 97.8°F | Resp 18 | Ht 60.0 in | Wt 145.0 lb

## 2017-10-08 DIAGNOSIS — K219 Gastro-esophageal reflux disease without esophagitis: Secondary | ICD-10-CM | POA: Diagnosis not present

## 2017-10-08 DIAGNOSIS — I10 Essential (primary) hypertension: Secondary | ICD-10-CM | POA: Insufficient documentation

## 2017-10-08 DIAGNOSIS — Z9221 Personal history of antineoplastic chemotherapy: Secondary | ICD-10-CM | POA: Insufficient documentation

## 2017-10-08 DIAGNOSIS — Z79899 Other long term (current) drug therapy: Secondary | ICD-10-CM | POA: Insufficient documentation

## 2017-10-08 DIAGNOSIS — C779 Secondary and unspecified malignant neoplasm of lymph node, unspecified: Secondary | ICD-10-CM | POA: Insufficient documentation

## 2017-10-08 DIAGNOSIS — T402X5A Adverse effect of other opioids, initial encounter: Secondary | ICD-10-CM | POA: Diagnosis not present

## 2017-10-08 DIAGNOSIS — Z8673 Personal history of transient ischemic attack (TIA), and cerebral infarction without residual deficits: Secondary | ICD-10-CM | POA: Insufficient documentation

## 2017-10-08 DIAGNOSIS — G893 Neoplasm related pain (acute) (chronic): Secondary | ICD-10-CM | POA: Insufficient documentation

## 2017-10-08 DIAGNOSIS — M6281 Muscle weakness (generalized): Secondary | ICD-10-CM

## 2017-10-08 DIAGNOSIS — C189 Malignant neoplasm of colon, unspecified: Secondary | ICD-10-CM | POA: Insufficient documentation

## 2017-10-08 DIAGNOSIS — E785 Hyperlipidemia, unspecified: Secondary | ICD-10-CM | POA: Insufficient documentation

## 2017-10-08 DIAGNOSIS — K760 Fatty (change of) liver, not elsewhere classified: Secondary | ICD-10-CM | POA: Diagnosis not present

## 2017-10-08 DIAGNOSIS — Z801 Family history of malignant neoplasm of trachea, bronchus and lung: Secondary | ICD-10-CM | POA: Diagnosis not present

## 2017-10-08 DIAGNOSIS — R2681 Unsteadiness on feet: Secondary | ICD-10-CM | POA: Diagnosis not present

## 2017-10-08 DIAGNOSIS — D259 Leiomyoma of uterus, unspecified: Secondary | ICD-10-CM | POA: Insufficient documentation

## 2017-10-08 DIAGNOSIS — Z9049 Acquired absence of other specified parts of digestive tract: Secondary | ICD-10-CM | POA: Diagnosis not present

## 2017-10-08 DIAGNOSIS — C7951 Secondary malignant neoplasm of bone: Secondary | ICD-10-CM | POA: Insufficient documentation

## 2017-10-08 DIAGNOSIS — C20 Malignant neoplasm of rectum: Secondary | ICD-10-CM | POA: Diagnosis not present

## 2017-10-08 DIAGNOSIS — K5903 Drug induced constipation: Secondary | ICD-10-CM | POA: Insufficient documentation

## 2017-10-08 DIAGNOSIS — I700 Atherosclerosis of aorta: Secondary | ICD-10-CM | POA: Diagnosis not present

## 2017-10-08 DIAGNOSIS — E669 Obesity, unspecified: Secondary | ICD-10-CM | POA: Insufficient documentation

## 2017-10-08 DIAGNOSIS — Z7189 Other specified counseling: Secondary | ICD-10-CM

## 2017-10-08 DIAGNOSIS — K429 Umbilical hernia without obstruction or gangrene: Secondary | ICD-10-CM | POA: Insufficient documentation

## 2017-10-08 NOTE — Progress Notes (Signed)
Hematology/Oncology Consult note [facility]  Telephone:([phone] [phone]  Patient Care Team: Paulene Floor as PCP - General (Physician Assistant)   Name of the patient: Theresa Brock  [id number]  01-Oct-1955   Date of visit: 10/08/17  Diagnosis- history of stage III colon cancerpT3 pN2 cM0status post total colectomy and 5 cycles of adjuvant chemotherapy with FOLFOX ending in October 2018 now with sacral mets  Chief complaint/ Reason for visit- discuss CT scan results  Heme/Onc history: patient is a 62 year old female with a past medical history of hypertension, hyperlipidemia and 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   She had presented with abdominal pain nausea vomiting and intermittent rectal bleeding in April 2018 and was found to have small bowel obstruction at that time.  Patient had a total colectomy on 06/04/2016 pathology showed invasive adenocarcinoma ring-type involving the cecum and the ascending colon. Tubulovillous adenoma with high-grade dysplasia in the ascending colon. 2 small tubular adenomas in the ascending colon. One inflammatory polyp in the ascending colon. Serrated adenoma in the transverse colon. 8 out of 19 pericolonic lymph nodes were positive for tumor. No loss of nuclear expression of MMR proteins. Tumor was poorly differentiated and invades through the muscularis propria into pericolonic tissue. No microscopy perforation. L VIN PNI were present. Margins were negative. pT3pN2b  Patient had a complicated postoperative course following the colectomy with postoperative ileus C. difficile colitis and was admitted in the ICU for septic shock with pressors. There was a considerable delay because of that in starting adjuvant  chemotherapy. CBC back in July 2018 was normal with a white count of 10.7, H&H of 12/37 and a platelet count of 445. LFTs were within normal limits and serum creatinine was 0.6. CT chest abdomen and pelvis in July 2018 did not reveal any evidence of recurrent or metastatic disease.  Patient was also seen by GYN for thickened endometrium and underwent transvaginal ultrasound in August 2018 which showed a 2.8 cm fibroid as well as endometrial thickening of 12.8 mm. Plan was for hysteroscopy and D&C in September 2018  Adjuvant chemotherapy with FOLFOX was planned every 2 weeks for 12 cycles which started in August 2018. Adjuvant chemotherapy was initiated on 09/30/2016 and from review of records so far I see that her last chemotherapy was cycle #5 that was given on 11/27/2016 after that chemo patient was admitted to the hospital on 12/07/2016 with renal failure and failure to thrive. Patient did not desire further chemo at that point  Patient has [place] from [place] to be with her daughter and son-in-law but is not currently sure how long she will stay here. She has lived in an [place] for the last 20 years. Currently patient feels back to her baseline after she stopped her chemotherapy in October 2018. She does not report any peripheral neuropathy from chemotherapy.    Interval history-reports pain in her tailbone which is getting worse and she is finding it hard to get a comfortable position to sit.  She was also seen in the ER for left hip pain and knee pain and was discharged on narcotics.  Appetite is fair and she denies any unintentional weight loss.  ECOG PS- 2-3 Pain scale- 4 Opioid associated constipation- no  Review of systems- Review of Systems  Constitutional:  Positive for malaise/fatigue. Negative for chills, fever and weight loss.  HENT: Negative for congestion, ear discharge and nosebleeds.   Eyes: Negative for blurred vision.  Respiratory: Negative for cough, hemoptysis,  sputum production, shortness of breath and wheezing.   Cardiovascular: Negative for chest pain, palpitations, orthopnea and claudication.  Gastrointestinal: Negative for abdominal pain, blood in stool, constipation, diarrhea, heartburn, melena, nausea and vomiting.  Genitourinary: Negative for dysuria, flank pain, frequency, hematuria and urgency.  Musculoskeletal: Negative for back pain, joint pain and myalgias.  Skin: Negative for rash.  Neurological: Negative for dizziness, tingling, focal weakness, seizures, weakness and headaches.  Endo/Heme/Allergies: Does not bruise/bleed easily.  Psychiatric/Behavioral: Negative for depression and suicidal ideas. The patient does not have insomnia.      No Known Allergies   Past Medical History:  Diagnosis Date  . Colon cancer ([place])   . Diplopia 07/05/2014   Overview:  Converted from [employer]: Description - DIPLOPIA  . Dysphagia, oropharyngeal phase 09/20/2013   Overview:  Converted from Centricity: Description - DYSPHAGIA, OROPHARYNGEAL PHASE  . Essential (primary) hypertension 09/20/2013   Overview:  Converted from Centricity: Description - HYPERTENSION, BENIGN ESSENTIAL  . GERD (gastroesophageal reflux disease)   . Hyperlipidemia   . Hypertension   . Malignant neoplasm of colon ([place]) 01/23/2017  . Stroke ([place])   . Tinea unguium 09/20/2013   Overview:  Converted from Centricity: Description - ONYCHOMYCOSIS, TOENAILS     Past Surgical History:  Procedure Laterality Date  . COLOSTOMY    . FLEXIBLE SIGMOIDOSCOPY N/A 05/14/2017   Procedure: FLEXIBLE SIGMOIDOSCOPY;  Surgeon: Lin Landsman, MD;  Location: [facility] ENDOSCOPY;  Service: Gastroenterology;  Laterality: N/A;  . PORT A CATH INJECTION ([place] [facility]) Right    7/18    Social History   Socioeconomic History  . Marital status: Married    Spouse name: Not on file  . Number of children: Not on file  . Years of education: Not on file  . Highest education level: Not on file    Occupational History  . Not on file  Social Needs  . Financial resource strain: Not on file  . Food insecurity:    Worry: Not on file    Inability: Not on file  . Transportation needs:    Medical: Not on file    Non-medical: Not on file  Tobacco Use  . Smoking status: Never Smoker  . Smokeless tobacco: Never Used  Substance and Sexual Activity  . Alcohol use: No  . Drug use: No  . Sexual activity: Not on file  Lifestyle  . Physical activity:    Days per week: Not on file    Minutes per session: Not on file  . Stress: Not on file  Relationships  . Social connections:    Talks on phone: Not on file    Gets together: Not on file    Attends religious service: Not on file    Active member of club or organization: Not on file    Attends meetings of clubs or organizations: Not on file    Relationship status: Not on file  . Intimate partner violence:    Fear of current or ex partner: Not on file    Emotionally abused: Not on file    Physically abused: Not on file    Forced sexual activity: Not on file  Other Topics Concern  . Not on file  Social History Narrative  . Not on file    Family History  Problem Relation Age  of Onset  . Cancer Brother   . Stroke Mother   . Lung cancer Father      Current Outpatient Medications:  .  acetaminophen (TYLENOL) 325 MG tablet, Take 650 mg by mouth every 6 (six) hours as needed., Disp: , Rfl:  .  atorvastatin (LIPITOR) 20 MG tablet, Take 1 tablet (20 mg total) by mouth daily at 6 PM., Disp: 90 tablet, Rfl: 1 .  diclofenac sodium (VOLTAREN) 1 % GEL, Apply 4 g topically 4 (four) times daily as needed (pain)., Disp: 100 g, Rfl: 0 .  HYDROcodone-acetaminophen (NORCO) 5-325 MG tablet, Take 1 tablet by mouth every 6 (six) hours as needed for up to 7 doses for severe pain. (Patient not taking: Reported on 09/11/2017), Disp: 7 tablet, Rfl: 0 .  ibuprofen (ADVIL,MOTRIN) 600 MG tablet, Take 600 mg by mouth every 6 (six) hours as needed., Disp: ,  Rfl:  .  losartan (COZAAR) 25 MG tablet, Take 1 tablet (25 mg total) by mouth daily., Disp: 90 tablet, Rfl: 1 .  Multiple Vitamin (MULTIVITAMIN) tablet, Take 1 tablet by mouth daily., Disp: , Rfl:   Physical exam:  Vitals:   10/08/17 1356  BP: 125/89  Pulse: (!) 118  Resp: 18  Temp: 97.8 F (36.6 C)  TempSrc: Tympanic  Weight: 145 lb (65.8 kg)  Height: 5' (1.524 m)   Physical Exam  Constitutional: She is oriented to person, place, and time.  She is obese and sitting in a wheelchair.  Left-sided hemiplegia due to prior stroke.  Ileostomy bag in place.  HENT:  Head: Normocephalic and atraumatic.  Eyes: Pupils are equal, round, and reactive to light. EOM are normal.  Neck: Normal range of motion.  Cardiovascular: Normal rate, regular rhythm and normal heart sounds.  Pulmonary/Chest: Effort normal and breath sounds normal.  Abdominal: Soft. Bowel sounds are normal.  Neurological: She is alert and oriented to person, place, and time.  Skin: Skin is warm and dry.     CMP Latest Ref Rng & Units 10/02/2017  Glucose 70 - 99 mg/dL 131(H)  BUN 8 - 23 mg/dL 11  Creatinine 0.44 - 1.00 mg/dL 0.77  Sodium 135 - 145 mmol/L 140  Potassium 3.5 - 5.1 mmol/L 3.7  Chloride 98 - 111 mmol/L 105  CO2 22 - 32 mmol/L 23  Calcium 8.9 - 10.3 mg/dL 9.4  Total Protein 6.5 - 8.1 g/dL 7.2  Total Bilirubin 0.3 - 1.2 mg/dL 0.5  Alkaline Phos 38 - 126 U/L 141(H)  AST 15 - 41 U/L 37  ALT 0 - 44 U/L 30   CBC Latest Ref Rng & Units 10/02/2017  WBC 3.6 - 11.0 K/uL 8.2  Hemoglobin 12.0 - 16.0 g/dL 12.7  Hematocrit 35.0 - 47.0 % 36.7  Platelets 150 - 440 K/uL 412    No images are attached to the encounter.  Ct Chest W Contrast  Result Date: 10/03/2017 CLINICAL DATA:  History of stage III colon carcinoma diagnosed 01/23/2017. Status post colectomy. EXAM: CT CHEST, ABDOMEN, AND PELVIS WITH CONTRAST TECHNIQUE: Multidetector CT imaging of the chest, abdomen and pelvis was performed following the standard  protocol during bolus administration of intravenous contrast. CONTRAST:  100 mL ISOVUE-300 IOPAMIDOL (ISOVUE-300) INJECTION 61% COMPARISON:  CT chest, abdomen and pelvis 04/08/2017. FINDINGS: CT CHEST FINDINGS Cardiovascular: Calcific aortic and coronary atherosclerosis is identified. Calcification of the mitral annulus is noted. Heart size is normal. Tiny amount of pericardial fluid noted. No aortic aneurysm. Mediastinum/Nodes: 1.4 cm low attenuating lesion in the  right lobe of the thyroid is 0.7 cm low attenuating lesion in the left lobe of the thyroid are unchanged. No lymphadenopathy. Esophagus appears normal. Lungs/Pleura: No pleural effusion. No pulmonary nodule, mass or consolidation. Minimal scar atelectasis in the lingula noted. Musculoskeletal: No acute abnormality. No lytic or sclerotic lesion. CT ABDOMEN PELVIS FINDINGS Hepatobiliary: No focal lesion. Fatty infiltration of the liver is again seen. The gallbladder and biliary tree appear normal. Pancreas: Unremarkable. No pancreatic ductal dilatation or surrounding inflammatory changes. Spleen: Normal in size without focal abnormality. Adrenals/Urinary Tract: A few tiny low attenuating lesions in the kidneys cannot be definitively characterized but are likely cysts and unchanged. The adrenal glands appear normal. Ureters and urinary bladder are unremarkable. The adrenal glands appear normal. Stomach/Bowel: Status post colectomy and right lower quadrant ileostomy. Otherwise negative. Vascular/Lymphatic: Aortic atherosclerosis. No enlarged abdominal or pelvic lymph nodes. Reproductive: Small uterine fibroid again seen.  No adnexal mass. Other: Small fat containing umbilical hernia noted. Musculoskeletal: A new destructive lesion in the left sacrum measures 5.3 cm craniocaudal by 3.2 cm AP x 3.2 cm transverse. The lesion extends into the left S1 foramen and could impact the S1 nerve root. No other focal bony lesion is identified. Degenerative disc disease in  the upper and mid lumbar spine is noted. IMPRESSION: New large destructive lesion in the left sacrum is most consistent with metastatic colon cancer. The lesion encroaches on the left S1 nerve root. No other evidence of metastatic disease is identified in the chest, abdomen or pelvis. Calcific aortic and coronary atherosclerosis. Fatty infiltration of the liver. Status post colectomy without evidence of complication. Electronically Signed   By: Inge Rise M.D.   On: 10/03/2017 08:51   Ct Abdomen Pelvis W Contrast  Result Date: 10/03/2017 CLINICAL DATA:  History of stage III colon carcinoma diagnosed 01/23/2017. Status post colectomy. EXAM: CT CHEST, ABDOMEN, AND PELVIS WITH CONTRAST TECHNIQUE: Multidetector CT imaging of the chest, abdomen and pelvis was performed following the standard protocol during bolus administration of intravenous contrast. CONTRAST:  100 mL ISOVUE-300 IOPAMIDOL (ISOVUE-300) INJECTION 61% COMPARISON:  CT chest, abdomen and pelvis 04/08/2017. FINDINGS: CT CHEST FINDINGS Cardiovascular: Calcific aortic and coronary atherosclerosis is identified. Calcification of the mitral annulus is noted. Heart size is normal. Tiny amount of pericardial fluid noted. No aortic aneurysm. Mediastinum/Nodes: 1.4 cm low attenuating lesion in the right lobe of the thyroid is 0.7 cm low attenuating lesion in the left lobe of the thyroid are unchanged. No lymphadenopathy. Esophagus appears normal. Lungs/Pleura: No pleural effusion. No pulmonary nodule, mass or consolidation. Minimal scar atelectasis in the lingula noted. Musculoskeletal: No acute abnormality. No lytic or sclerotic lesion. CT ABDOMEN PELVIS FINDINGS Hepatobiliary: No focal lesion. Fatty infiltration of the liver is again seen. The gallbladder and biliary tree appear normal. Pancreas: Unremarkable. No pancreatic ductal dilatation or surrounding inflammatory changes. Spleen: Normal in size without focal abnormality. Adrenals/Urinary Tract: A  few tiny low attenuating lesions in the kidneys cannot be definitively characterized but are likely cysts and unchanged. The adrenal glands appear normal. Ureters and urinary bladder are unremarkable. The adrenal glands appear normal. Stomach/Bowel: Status post colectomy and right lower quadrant ileostomy. Otherwise negative. Vascular/Lymphatic: Aortic atherosclerosis. No enlarged abdominal or pelvic lymph nodes. Reproductive: Small uterine fibroid again seen.  No adnexal mass. Other: Small fat containing umbilical hernia noted. Musculoskeletal: A new destructive lesion in the left sacrum measures 5.3 cm craniocaudal by 3.2 cm AP x 3.2 cm transverse. The lesion extends into the left S1 foramen  and could impact the S1 nerve root. No other focal bony lesion is identified. Degenerative disc disease in the upper and mid lumbar spine is noted. IMPRESSION: New large destructive lesion in the left sacrum is most consistent with metastatic colon cancer. The lesion encroaches on the left S1 nerve root. No other evidence of metastatic disease is identified in the chest, abdomen or pelvis. Calcific aortic and coronary atherosclerosis. Fatty infiltration of the liver. Status post colectomy without evidence of complication. Electronically Signed   By: Inge Rise M.D.   On: 10/03/2017 08:51     Assessment and plan- Patient is a 62 y.o. female with history of stage III colon cancer in August 2018 and status post 5 cycles of adjuvant FOLFOX.  She could not complete further chemotherapy due to significant complications.  She is currently under surveillance and is here to discuss the results of her CT scan  I have reviewed CT chest abdomen and pelvis images independently and discussed findings with the patient.  Unfortunately found to have a large destructive 5.3 cm lesion in her left sacrum which extends into the S1 foramen and impacting the S1.  No other evidence of metastatic disease.  Clinically this is highly  suspicious for metastatic colon cancer.  Given that patient is having significant pain in her tailbone as well as left hip she will be seen by radiation oncology today to discuss palliative radiation. I have asked her to try narcotics given by ed to see if it helps as she has not tried that yet.   We will try to obtain pathology slides from outside hospital at this time for further testing including K-ras and BRAF if it was not done previously as well as PDL 1 testing.  If we are unable to obtain outside pathology slides I would favor repeating biopsy at this time.  I also discussed that given that she has metastatic disease I would favor offering palliative chemotherapy following radiation treatment if she is interested.  Given that she has had significant problems tolerating FOLFOX chemotherapy in the past, we could potentially start off with infusional 5-FU chemotherapy IV every 2 weeks and see how she tolerates it.  She does have comorbidities including obesity and prior stroke which limit her mobility.  Discussed risks and benefits of chemotherapy including all but not limited to nausea, vomiting, fatigue, low blood counts and risk of infections and hospitalization.  Discussed that chemotherapy would be palliative and not curative.  Typically chemotherapy and colon cancer is given until progression or toxicity but given her performance status we could see how she tolerates chemotherapy for 4 to 6 months and based on her response we could see if we could give her a break from chemotherapy in the future.  Patient would like to think about all these options and get back to me.  She will need a new port placement as her prior port was taken out.  We will give her a call next week to know her decision.  I will tentatively see her in about 2 to 3 weeks time with CBC and CMP for cycle 1 of infusional 5-FU chemotherapy  Also discussed the role of Zometa for bony metastases to decrease incidence of skeletal  related fractures.  Discussed risks and benefits of Zometa including all but not limited to fatigue, hypocalcemia and osteonecrosis of the jaw.  Patient has never seen a dentist and does not wish to see one at this time.  She will think about risks  and benefits of Zometa and will let us know if she wishes to proceed with it   Visit Diagnosis 1. Malignant neoplasm of colon, unspecified part of colon ([place])   2. Bone metastases ([place])   3. Neoplasm related pain   4. Goals of care, counseling/discussion      Dr. Randa Evens, MD, MPH [facility] at [facility] [phone] 10/08/2017 1:48 PM

## 2017-10-08 NOTE — Consult Note (Signed)
NEW PATIENT EVALUATION  Name: Theresa Brock  MRN: [id number]  Date:   10/08/2017     DOB: [birth year]   This 62 y.o. 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  REFERRING PHYSICIAN: Trinna Post, PA-C  CHIEF COMPLAINT: No chief complaint on file.   DIAGNOSIS: There were no encounter diagnoses.   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 8 of 19 pericolonic lymph nodes were positive for metastatic disease. Tumor also invaded through the muscularis propria into pericolonic tissue.her adjuvant chemotherapy was significantly delayed based on postoperative complications. FOLFOX chemotherapy was started in October 2018 for which she received 5 cycles although this was discontinued secondary to renal failure and failure to thrive. She's been having significant gluteal type pain and recent CT scanof chest abdomen and pelvis showed a large destructive lesion the left sacrum consistent with metastatic disease. The tumor encroaches on the left S1 nerve root. No other evidence of metastatic disease was seen. She seen today for evaluation of palliative treatment to her sacrum.  PLANNED TREATMENT REGIMEN: palliative radiation therapy to sacrum  PAST MEDICAL HISTORY:  has a past medical history of Colon cancer ([place]), Diplopia (07/05/2014), Dysphagia, oropharyngeal phase (09/20/2013), Essential (primary) hypertension (09/20/2013), GERD (gastroesophageal reflux disease), Hyperlipidemia, Hypertension, Malignant neoplasm of colon  ([place]) (01/23/2017), Stroke ([place]), and Tinea unguium (09/20/2013).    PAST SURGICAL HISTORY:  Past Surgical History:  Procedure Laterality Date  . COLOSTOMY    . FLEXIBLE SIGMOIDOSCOPY N/A 05/14/2017   Procedure: FLEXIBLE SIGMOIDOSCOPY;  Surgeon: Lin Landsman, MD;  Location: [facility] ENDOSCOPY;  Service: Gastroenterology;  Laterality: N/A;  . PORT A CATH INJECTION ([place] HX) Right    7/18    FAMILY HISTORY: family history includes Lung cancer in her father; Stroke in her mother; Throat cancer in her brother.  SOCIAL HISTORY:  reports that she has never smoked. She has never used smokeless tobacco. She reports that she does not drink alcohol or use drugs.  ALLERGIES: Patient has no known allergies.  MEDICATIONS:  Current Outpatient Medications  Medication Sig Dispense Refill  . acetaminophen (TYLENOL) 325 MG tablet Take 650 mg by mouth every 6 (six) hours as needed.    Marland Kitchen atorvastatin (LIPITOR) 20 MG tablet Take 1 tablet (20 mg total) by mouth daily at 6 PM. 90 tablet 1  . diclofenac sodium (VOLTAREN) 1 % GEL Apply 4 g topically 4 (four) times daily as needed (pain). 100 g 0  . HYDROcodone-acetaminophen (NORCO) 5-325 MG tablet Take 1 tablet by mouth every 6 (six) hours as needed for up to 7 doses for severe pain. (Patient not taking: Reported on 09/11/2017) 7 tablet 0  . ibuprofen (ADVIL,MOTRIN) 600 MG tablet Take 600 mg by mouth every 6 (six) hours as needed.    Marland Kitchen losartan (COZAAR) 25 MG tablet Take 1 tablet (25 mg total) by mouth daily. 90 tablet 1  . Multiple Vitamin (MULTIVITAMIN) tablet Take 1 tablet by mouth daily.     No current facility-administered medications for this encounter.     ECOG PERFORMANCE STATUS:  2 - Symptomatic, <50% confined to bed  REVIEW OF SYSTEMS: patient does have left-sided weakness second of CVA. She's also having significant pain in her sacral region as described above. Patient denies any weight loss, fatigue, weakness, fever, chills or night sweats.  Patient denies any loss of vision, blurred vision. Patient denies any ringing  of the ears or hearing loss. No irregular heartbeat. Patient denies heart murmur or history of fainting. Patient denies any chest pain or pain radiating to her upper extremities. Patient denies any shortness of breath, difficulty breathing at night, cough or hemoptysis. Patient denies any swelling in the lower legs. Patient denies any nausea vomiting, vomiting of blood, or coffee ground material in the vomitus. Patient denies any stomach pain. Patient states has had normal bowel movements no significant constipation or diarrhea. Patient denies any dysuria, hematuria or significant nocturia. Patient denies any problems walking, swelling in the joints or loss of balance. Patient denies any skin changes, loss of hair or loss of weight. Patient denies any excessive worrying or anxiety or significant depression. Patient denies any problems with insomnia. Patient denies excessive thirst, polyuria, polydipsia. Patient denies any swollen glands, patient denies easy bruising or easy bleeding. Patient denies any recent infections, allergies or URI. Patient "s visual fields have not changed significantly in recent time.    PHYSICAL EXAM: There were no vitals taken for this visit. A well-developed wheelchair-bound female in NAD she does have a left lower leg brace. She has significant left-sided weakness from prior CVA. Range of motion of her left lower external does not elicit pain.Well-developed well-nourished patient in NAD. HEENT reveals PERLA, EOMI, discs not visualized.  Oral cavity is clear. No oral mucosal lesions are identified. Neck is clear without evidence of cervical or supraclavicular adenopathy. Lungs are clear to A&P. Cardiac examination is essentially unremarkable with regular rate and rhythm without murmur rub or thrill. Abdomen is benign with no organomegaly or masses noted. Motor sensory and DTR levels are equal and symmetric  in the upper and lower extremities. Cranial nerves II through XII are grossly intact. Proprioception is intact. No peripheral adenopathy or edema is identified. No motor or sensory levels are noted. Crude visual fields are within normal range.  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 Would plan on delivering 3000 cGy in 10 fractions. Would try a 3 field arrangement with 2 laterals and posterior field to try to spare small bowel is much is possible. Risks and benefits of treatment including skin reaction fatigue alteration of blood counts possible diarrhea all were discussed in detail with the patient and her son. They both seem to comprehend my treatment plan well. I have personally set up and ordered CT simulation for early next week.  I would like to take this opportunity to thank you for allowing me to participate in the care of your patient.Noreene Filbert, MD

## 2017-10-08 NOTE — Therapy (Signed)
[place] MAIN [facility] SERVICES [address] [place], [place], [address] Phone: [phone]   Fax:  [phone]  Physical Therapy Treatment  Patient Details  Name: Theresa Brock MRN: [id number] Date of Birth: Feb 21, 1955 Referring Provider: Dr. Terrilee Croak   Encounter Date: 10/08/2017  PT End of Session - 10/08/17 0910    Visit Number  3    Number of Visits  25    Date for PT Re-Evaluation  12/24/17    Authorization Type  Progress note: 1/10    Authorization Time Period  Last goals: 10/01/17    PT Start Time  0901    PT Stop Time  0941    PT Time Calculation (min)  40 min    Equipment Utilized During Treatment  Gait belt   Left hinged AFO    Activity Tolerance  Patient tolerated treatment well;Patient limited by fatigue    Behavior During Therapy  [facility] for tasks assessed/performed       Past Medical History:  Diagnosis Date  . Colon cancer ([place])   . Diplopia 07/05/2014   Overview:  Converted from Centricity: Description - DIPLOPIA  . Dysphagia, oropharyngeal phase 09/20/2013   Overview:  Converted from Centricity: Description - DYSPHAGIA, OROPHARYNGEAL PHASE  . Essential (primary) hypertension 09/20/2013   Overview:  Converted from Centricity: Description - HYPERTENSION, BENIGN ESSENTIAL  . GERD (gastroesophageal reflux disease)   . Hyperlipidemia   . Hypertension   . Malignant neoplasm of colon ([place]) 01/23/2017  . Stroke ([place])   . Tinea unguium 09/20/2013   Overview:  Converted from Centricity: Description - ONYCHOMYCOSIS, TOENAILS    Past Surgical History:  Procedure Laterality Date  . COLOSTOMY    . FLEXIBLE SIGMOIDOSCOPY N/A 05/14/2017   Procedure: FLEXIBLE SIGMOIDOSCOPY;  Surgeon: Lin Landsman, MD;  Location: [facility] ENDOSCOPY;  Service: Gastroenterology;  Laterality: N/A;  . PORT A CATH INJECTION ([place] HX) Right    7/18    There were no vitals filed for this visit.  Subjective Assessment - 10/08/17 0906    Subjective  Pt  reports she was worn out after last session, took a good nap. She received her HEP via email, printed it, and has performed it once. She continues to c/o pain in her leg from her brace across the tibia, but [facility]     Pertinent History  Pt sufferred a CVA on 2015. Husband reports it was a "brain bleed" on the right side of her brain "in the middle." She has not had any further imaging of her brain since that stroke. Pt suffered from LUE/LLE weakness after the CVA, upper extremity more affected than lower extremity. Pt worked with physical therapy after her CVA but still had profound hemiparesis. She underwent colon cancer surgery May 2018. She currently has a colostomy bag. Pt was in the hospital for 1 months and then spent an additional month in rehab. Following surgery she underwent chemotherapy from September through November 2018. Pt became very weak and sick from the chemotherapy. She reports progressive weakness since her colon surgery. She has not had any therapy since November 2018. Husband reports gradual and progressive worsening of ambulation. Husband guards her with ambulation and she is currently using a quad cane. They live in an RV and pt is unable to enter/exit steps to RV without significant assist from husband. Husband reports that when entering/exiting RV with patient he does approximately "60% of the work." No falls in the last  6 months. Pt wears a hinged L metal AFO since 2016. She tried a plastic AFO initially but it did not provide enough ankle stability. She is concerned that it might not currently be fitting right. No reported recent changes in her health/medications. She does endorse some L knee pain which she is concerned may limit her ability to strengthen. No scheduled follow-up with MD. No red flags currently with the exception of personal history of cancer.     Currently in Pain?  Yes    Pain Score  5     Pain Location  --   Left tibia at AFO strap        Treatment This Date:  -AMB c hemiwalker in RUE -86ft,MinGuardA -61ft MinGuardA 0.103m/s  -37ft MinGuardA 0.97m/s   -STS transfers from [facility]: 1x3 during gait training -STS transfers sequential c HW: 1x6 (minimal cues given, safety only as to allow patient to discover her own best motor patterns)    -Seated in [place] RLE marching: 2x15  -Seated in [place] RLE LAQ: 1x15 @ 2.5lb, 1x12 @4lb   -Seated in WC LLE LAQ: 2x10 AA/ROM (weakest in last 20 degrees of TKE)   *tactile and verbal cues required for correct performance and form; minGuard assist required for safe transfers and gait training.       PT Short Term Goals - 10/06/17 1001      PT SHORT TERM GOAL #1   Title  Pt will be independent with HEP in order to improve strength and balance in order to decrease fall risk and improve function at home and work.     Time  6    Period  Weeks    Status  On-going    Target Date  11/12/17        PT Long Term Goals - 10/06/17 1008      PT LONG TERM GOAL #1   Title  Pt will improve BERG by at least 3 points in order to demonstrate clinically significant improvement in balance.      Baseline  10/01/17: 19/56    Time  12    Period  Weeks    Status  On-going    Target Date  12/24/17      PT LONG TERM GOAL #2   Title  Pt will improve ABC by at least 13% in order to demonstrate clinically significant improvement in balance confidence.     Baseline  10/01/17: 18.75%    Time  12    Period  Weeks    Status  On-going    Target Date  12/24/17      PT LONG TERM GOAL #3   Title  Pt will decrease TUG by 23% (13.2s) in order to demonstrate decreased fall risk.    Baseline  10/01/17: 57.4s    Time  12    Period  Weeks    Status  On-going    Target Date  12/24/17      PT LONG TERM GOAL #4   Title  Pt will increase 10MWT by at least 0.13 m/s in order to demonstrate clinically significant improvement in community ambulation.     Baseline  10/01/17: Self-selected: 63.53s = 0.16 m/s;     Time  12     Period  Weeks    Status  On-going    Target Date  12/24/17            Plan - 10/08/17 0929    Clinical Impression Statement  Session with  focus on gait training first while energy is at its highest, tolerating 3 bouts of ~15 ft. Progressed then to transfers training from levated surface + airex pad in WC. Noted RLE (uninvolved side) limitations during AMB with excessive flexed knee posturing and quivering once fatigued. Then moved to strengthening with seated based program. Pt with noted edema just below the knee joint, warrants a further examination in future visits. Pt report spoor tolerance to light touch    Rehab Potential  Fair    Clinical Impairments Affecting Rehab Potential  Positive: motivation; Negative: chronicity    PT Frequency  2x / week    PT Duration  12 weeks    PT Next Visit Plan  skin integrity and edema examination of LLE knee to foot, consider utility of ted hose at night?, progress HEP as needed, progress gait training and strenghtening, consider deferring NUStep as gait is more of anecessity at this time.  bodyweight support treadmill likely not ideal d/t placement of colostomy.     PT Home Exercise Plan  STS from [facility] (elevated seat surface), Seated LLE LAQ, Seated LLE marching.     Consulted and Agree with Plan of Care  Patient;Family member/caregiver    Family Member Consulted  Husband       Patient will benefit from skilled therapeutic intervention in order to improve the following deficits and impairments:  Abnormal gait, Decreased activity tolerance, Decreased balance, Decreased mobility, Decreased strength, Difficulty walking  Visit Diagnosis: Unsteadiness on feet  Muscle weakness (generalized)     Problem List Patient Active Problem List   Diagnosis Date Noted  . 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 Malignant neoplasm of colon ([place]) 01/23/2017  . Encounter for follow-up surveillance of colon cancer 01/23/2017  . Adenocarcinoma ([place]) 06/15/2016   . 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 Abnormal CT scan, gastrointestinal tract 05/26/2016  . Diplopia 07/05/2014  . Constipation 09/20/2013  . Dysphagia, oropharyngeal phase 09/20/2013  . Encounter for general adult medical examination without abnormal findings 09/20/2013  . Essential (primary) hypertension 09/20/2013  . Hemiplegia and hemiparesis following unspecified cerebrovascular disease affecting unspecified side ([place]) 09/20/2013  . Hyperlipidemia 09/20/2013  . Other disorders of intestinal carbohydrate absorption 09/20/2013  . Tinea unguium 09/20/2013   9:46 AM, 10/08/17 Etta Grandchild, PT, DPT Physical Therapist - [place] [phone]    Etta Grandchild 10/08/2017, 9:43 AM  [place] MAIN [facility] SERVICES [address] [place], [place], [address] Phone: [phone]   Fax:  [phone]  Name: Theresa Brock MRN: [id number] Date of Birth: 10/21/1955

## 2017-10-08 NOTE — Progress Notes (Signed)
Pt has no c/o of colon cancer issues- having pain in left knee and left shoulder today, even when husband puts her sock on left foot it is painful to touch

## 2017-10-09 ENCOUNTER — Telehealth: Payer: Self-pay | Admitting: *Deleted

## 2017-10-09 MED ORDER — OXYCODONE HCL 5 MG PO TABS: 5.0000 mg | ORAL_TABLET | Freq: Four times a day (QID) | ORAL | 0 refills | Status: DC | PRN

## 2017-10-09 NOTE — Telephone Encounter (Signed)
New oxycodone prescription

## 2017-10-13 ENCOUNTER — Ambulatory Visit
Admission: RE | Admit: 2017-10-13 | Discharge: 2017-10-13 | Disposition: A | Discharge status: Home / Self Care | Payer: BLUE CROSS/BLUE SHIELD | Source: Ambulatory Visit | Attending: Radiation Oncology | Admitting: Radiation Oncology

## 2017-10-13 ENCOUNTER — Ambulatory Visit: Payer: BLUE CROSS/BLUE SHIELD | Admitting: Physical Therapy

## 2017-10-13 DIAGNOSIS — C7951 Secondary malignant neoplasm of bone: Secondary | ICD-10-CM | POA: Insufficient documentation

## 2017-10-13 DIAGNOSIS — C189 Malignant neoplasm of colon, unspecified: Secondary | ICD-10-CM | POA: Diagnosis not present

## 2017-10-13 DIAGNOSIS — Z510 Encounter for antineoplastic radiation therapy: Secondary | ICD-10-CM | POA: Diagnosis not present

## 2017-10-14 DIAGNOSIS — C7951 Secondary malignant neoplasm of bone: Secondary | ICD-10-CM | POA: Diagnosis not present

## 2017-10-15 ENCOUNTER — Ambulatory Visit: Payer: BLUE CROSS/BLUE SHIELD | Admitting: Physical Therapy

## 2017-10-19 ENCOUNTER — Encounter: Payer: Self-pay | Admitting: *Deleted

## 2017-10-20 ENCOUNTER — Ambulatory Visit
Admission: RE | Admit: 2017-10-20 | Discharge: 2017-10-20 | Disposition: A | Discharge status: Home / Self Care | Payer: BLUE CROSS/BLUE SHIELD | Source: Ambulatory Visit | Attending: Radiation Oncology | Admitting: Radiation Oncology

## 2017-10-20 ENCOUNTER — Other Ambulatory Visit: Payer: Self-pay | Admitting: *Deleted

## 2017-10-20 ENCOUNTER — Ambulatory Visit: Payer: BLUE CROSS/BLUE SHIELD | Admitting: Physical Therapy

## 2017-10-20 DIAGNOSIS — C189 Malignant neoplasm of colon, unspecified: Secondary | ICD-10-CM

## 2017-10-20 DIAGNOSIS — C7951 Secondary malignant neoplasm of bone: Secondary | ICD-10-CM | POA: Diagnosis not present

## 2017-10-21 ENCOUNTER — Ambulatory Visit
Admission: RE | Admit: 2017-10-21 | Discharge: 2017-10-21 | Disposition: A | Discharge status: Home / Self Care | Payer: BLUE CROSS/BLUE SHIELD | Source: Ambulatory Visit | Attending: Radiation Oncology | Admitting: Radiation Oncology

## 2017-10-21 DIAGNOSIS — C7951 Secondary malignant neoplasm of bone: Secondary | ICD-10-CM | POA: Diagnosis not present

## 2017-10-21 LAB — SLIDE CONSULT, PATHOLOGY ARMC

## 2017-10-22 ENCOUNTER — Ambulatory Visit: Payer: BLUE CROSS/BLUE SHIELD | Admitting: Physical Therapy

## 2017-10-22 ENCOUNTER — Ambulatory Visit
Admission: RE | Admit: 2017-10-22 | Discharge: 2017-10-22 | Disposition: A | Discharge status: Home / Self Care | Payer: BLUE CROSS/BLUE SHIELD | Source: Ambulatory Visit | Attending: Radiation Oncology | Admitting: Radiation Oncology

## 2017-10-22 ENCOUNTER — Ambulatory Visit: Payer: BLUE CROSS/BLUE SHIELD | Admitting: Oncology

## 2017-10-22 DIAGNOSIS — C7951 Secondary malignant neoplasm of bone: Secondary | ICD-10-CM | POA: Diagnosis not present

## 2017-10-23 ENCOUNTER — Ambulatory Visit
Admission: RE | Admit: 2017-10-23 | Discharge: 2017-10-23 | Disposition: A | Discharge status: Home / Self Care | Payer: BLUE CROSS/BLUE SHIELD | Source: Ambulatory Visit | Attending: Radiation Oncology | Admitting: Radiation Oncology

## 2017-10-23 ENCOUNTER — Ambulatory Visit: Payer: BLUE CROSS/BLUE SHIELD

## 2017-10-23 ENCOUNTER — Inpatient Hospital Stay (HOSPITAL_BASED_OUTPATIENT_CLINIC_OR_DEPARTMENT_OTHER): Payer: BLUE CROSS/BLUE SHIELD | Admitting: Oncology

## 2017-10-23 ENCOUNTER — Encounter: Payer: Self-pay | Admitting: Oncology

## 2017-10-23 ENCOUNTER — Other Ambulatory Visit: Payer: BLUE CROSS/BLUE SHIELD

## 2017-10-23 VITALS — BP 123/87 | HR 119 | Temp 97.3°F | Resp 18 | Ht 60.0 in | Wt 146.3 lb

## 2017-10-23 DIAGNOSIS — G893 Neoplasm related pain (acute) (chronic): Secondary | ICD-10-CM

## 2017-10-23 DIAGNOSIS — Z9221 Personal history of antineoplastic chemotherapy: Secondary | ICD-10-CM

## 2017-10-23 DIAGNOSIS — C189 Malignant neoplasm of colon, unspecified: Secondary | ICD-10-CM | POA: Diagnosis not present

## 2017-10-23 DIAGNOSIS — C7951 Secondary malignant neoplasm of bone: Secondary | ICD-10-CM

## 2017-10-23 DIAGNOSIS — Z7189 Other specified counseling: Secondary | ICD-10-CM

## 2017-10-23 NOTE — Progress Notes (Signed)
No new changes noted today 

## 2017-10-26 ENCOUNTER — Inpatient Hospital Stay: Payer: BLUE CROSS/BLUE SHIELD

## 2017-10-26 ENCOUNTER — Ambulatory Visit
Admission: RE | Admit: 2017-10-26 | Discharge: 2017-10-26 | Disposition: A | Discharge status: Home / Self Care | Payer: BLUE CROSS/BLUE SHIELD | Source: Ambulatory Visit | Attending: Radiation Oncology | Admitting: Radiation Oncology

## 2017-10-26 DIAGNOSIS — C7951 Secondary malignant neoplasm of bone: Secondary | ICD-10-CM | POA: Diagnosis not present

## 2017-10-26 MED ORDER — DEXAMETHASONE 4 MG PO TABS: 8.0000 mg | ORAL_TABLET | Freq: Every day | ORAL | 5 refills | Status: DC

## 2017-10-26 MED ORDER — LOPERAMIDE HCL 2 MG PO TABS: 2.0000 mg | ORAL_TABLET | Freq: Three times a day (TID) | ORAL | 1 refills | Status: DC | PRN

## 2017-10-26 MED ORDER — ONDANSETRON HCL 8 MG PO TABS: 8.0000 mg | ORAL_TABLET | Freq: Two times a day (BID) | ORAL | 1 refills | Status: DC | PRN

## 2017-10-26 MED ORDER — LORAZEPAM 1 MG PO TABS: 1.0000 mg | ORAL_TABLET | Freq: Four times a day (QID) | ORAL | 0 refills | Status: DC | PRN

## 2017-10-26 MED ORDER — PROCHLORPERAZINE MALEATE 10 MG PO TABS: 10.0000 mg | ORAL_TABLET | Freq: Four times a day (QID) | ORAL | 1 refills | Status: DC | PRN

## 2017-10-26 NOTE — Progress Notes (Signed)
START ON PATHWAY REGIMEN - Colorectal     A cycle is every 14 days:     Irinotecan      Leucovorin      5-Fluorouracil      5-Fluorouracil   **Always confirm dose/schedule in your pharmacy ordering system**  Patient Characteristics: Metastatic Colorectal, Second Line, KRAS Mutation Positive/Unknown, BRAF Wild-Type/Unknown, Bevacizumab Ineligible Current evidence of distant metastases<= Yes AJCC T Category: T3 AJCC N Category: N2 AJCC M Category: M1a AJCC 8 Stage Grouping: IVA BRAF Mutation Status: Awaiting Test Results KRAS/NRAS Mutation Status: Awaiting Test Results Line of therapy: Second Line  Intent of Therapy: Non-Curative / Palliative Intent, Discussed with Patient

## 2017-10-26 NOTE — Progress Notes (Signed)
Hematology/Oncology Consult note [facility]  Telephone:([phone] [phone]  Patient Care Team: Paulene Floor as PCP - General (Physician Assistant)   Name of the patient: Theresa Brock  [id number]  10-27-1955   Date of visit: 10/26/17  Diagnosis- history of stage III colon cancerpT3 pN2 cM0status post total colectomy and 5 cycles of adjuvant chemotherapy with FOLFOX ending in October 2018 now with sacral mets  Chief complaint/ Reason for visit- discuss options for palliative chemotherapy  Heme/Onc history: patient is a 62 year old female with a past medical history of hypertension, hyperlipidemia and 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   She had presented with abdominal pain nausea vomiting and intermittent rectal bleeding in April 2018 and was found to have small bowel obstruction at that time.  Patient had a total colectomy on 06/04/2016 pathology showed invasive adenocarcinoma ring-type involving the cecum and the ascending colon. Tubulovillous adenoma with high-grade dysplasia in the ascending colon. 2 small tubular adenomas in the ascending colon. One inflammatory polyp in the ascending colon. Serrated adenoma in the transverse colon. 8 out of 19 pericolonic lymph nodes were positive for tumor. No loss of nuclear expression of MMR proteins. Tumor was poorly differentiated and invades through the muscularis propria into pericolonic tissue. No microscopy perforation. L VIN PNI were present. Margins were negative. pT3pN2b  Patient had a complicated postoperative course following the colectomy with postoperative ileus C. difficile colitis and was admitted in the ICU for septic shock with pressors. There was a considerable delay because of that  in starting adjuvant chemotherapy. CBC back in July 2018 was normal with a white count of 10.7, H&H of 12/37 and a platelet count of 445. LFTs were within normal limits and serum creatinine was 0.6. CT chest abdomen and pelvis in July 2018 did not reveal any evidence of recurrent or metastatic disease.  Patient was also seen by GYN for thickened endometrium and underwent transvaginal ultrasound in August 2018 which showed a 2.8 cm fibroid as well as endometrial thickening of 12.8 mm. Plan was for hysteroscopy and D&C in September 2018  Adjuvant chemotherapy with FOLFOX was planned every 2 weeks for 12 cycles which started in August 2018. Adjuvant chemotherapy was initiated on 09/30/2016 and from review of records so far I see that her last chemotherapy was cycle #5 that was given on 11/27/2016 after that chemo patient was admitted to the hospital on 12/07/2016 with renal failure and failure to thrive. Patient did not desire further chemo at that point  Patient has Arthur from [place] to be with her daughter and son-in-law but is not currently sure how long she will stay here. She has lived in an [place] for the last 20 years. Currently patient feels back to her baseline after she stopped her chemotherapy in October 2018. She does not report any peripheral neuropathy from chemotherapy.     Interval history-patient just started radiation treatment to her sacrum and has not experienced any significant relief yet.  She is using PRN oxycodone for her pain.  Family reports that her ambulation has gradually worsened over the last couple of months.  Earlier she was able to get out of her wheelchair and at least walk to the bathroom with her crutches but recently she has been limping more on her right  limb as well and unstable when she walks.  A family member was required to be following her for all her ADLs which is making it difficult for her husband.  Also reports worsening pain in her right shoulder  as well as left knee  ECOG PS- 2 Pain scale- 5 Opioid associated constipation- no  Review of systems- Review of Systems  Constitutional: Positive for malaise/fatigue. Negative for chills, fever and weight loss.  HENT: Negative for congestion, ear discharge and nosebleeds.   Eyes: Negative for blurred vision.  Respiratory: Negative for cough, hemoptysis, sputum production, shortness of breath and wheezing.   Cardiovascular: Negative for chest pain, palpitations, orthopnea and claudication.  Gastrointestinal: Negative for abdominal pain, blood in stool, constipation, diarrhea, heartburn, melena, nausea and vomiting.  Genitourinary: Negative for dysuria, flank pain, frequency, hematuria and urgency.  Musculoskeletal: Positive for joint pain. Negative for back pain and myalgias.  Skin: Negative for rash.  Neurological: Negative for dizziness, tingling, focal weakness, seizures, weakness and headaches.       Difficulty with ambulation  Endo/Heme/Allergies: Does not bruise/bleed easily.  Psychiatric/Behavioral: Negative for depression and suicidal ideas. The patient does not have insomnia.        No Known Allergies   Past Medical History:  Diagnosis Date  . Colon cancer ([place])   . Diplopia 07/05/2014   Overview:  Converted from Centricity: Description - DIPLOPIA  . Dysphagia, oropharyngeal phase 09/20/2013   Overview:  Converted from Centricity: Description - DYSPHAGIA, OROPHARYNGEAL PHASE  . Essential (primary) hypertension 09/20/2013   Overview:  Converted from Centricity: Description - HYPERTENSION, BENIGN ESSENTIAL  . GERD (gastroesophageal reflux disease)   . Hyperlipidemia   . Hypertension   . Malignant neoplasm of colon ([place]) 01/23/2017  . Stroke ([place])   . Tinea unguium 09/20/2013   Overview:  Converted from Centricity: Description - ONYCHOMYCOSIS, TOENAILS     Past Surgical History:  Procedure Laterality Date  . COLOSTOMY    . FLEXIBLE SIGMOIDOSCOPY N/A 05/14/2017    Procedure: FLEXIBLE SIGMOIDOSCOPY;  Surgeon: Lin Landsman, MD;  Location: [facility] ENDOSCOPY;  Service: Gastroenterology;  Laterality: N/A;  . PORT A CATH INJECTION ([place] HX) Right    7/18    Social History   Socioeconomic History  . Marital status: Married    Spouse name: Not on file  . Number of children: Not on file  . Years of education: Not on file  . Highest education level: Not on file  Occupational History  . Not on file  Social Needs  . Financial resource strain: Not on file  . Food insecurity:    Worry: Not on file    Inability: Not on file  . Transportation needs:    Medical: Not on file    Non-medical: Not on file  Tobacco Use  . Smoking status: Never Smoker  . Smokeless tobacco: Never Used  Substance and Sexual Activity  . Alcohol use: No  . Drug use: No  . Sexual activity: Not on file  Lifestyle  . Physical activity:    Days per week: Not on file    Minutes per session: Not on file  . Stress: Not on file  Relationships  . Social connections:    Talks on phone: Not on file    Gets together: Not on file    Attends religious service: Not on file    Active member of club or organization: Not on file    Attends meetings of clubs or organizations: Not on file  Relationship status: Not on file  . Intimate partner violence:    Fear of current or ex partner: Not on file    Emotionally abused: Not on file    Physically abused: Not on file    Forced sexual activity: Not on file  Other Topics Concern  . Not on file  Social History Narrative  . Not on file    Family History  Problem Relation Age of Onset  . Throat cancer Brother   . Stroke Mother   . Lung cancer Father      Current Outpatient Medications:  .  atorvastatin (LIPITOR) 20 MG tablet, Take 1 tablet (20 mg total) by mouth daily at 6 PM., Disp: 90 tablet, Rfl: 1 .  HYDROcodone-acetaminophen (NORCO) 5-325 MG tablet, Take 1 tablet by mouth every 6 (six) hours as needed for up to 7 doses for  severe pain., Disp: 7 tablet, Rfl: 0 .  losartan (COZAAR) 25 MG tablet, Take 1 tablet (25 mg total) by mouth daily., Disp: 90 tablet, Rfl: 1 .  Multiple Vitamin (MULTIVITAMIN) tablet, Take 1 tablet by mouth daily., Disp: , Rfl:  .  oxyCODONE (ROXICODONE) 5 MG immediate release tablet, Take 1 tablet (5 mg total) by mouth every 6 (six) hours as needed for severe pain., Disp: 60 tablet, Rfl: 0 .  acetaminophen (TYLENOL) 325 MG tablet, Take 650 mg by mouth every 6 (six) hours as needed., Disp: , Rfl:  .  diclofenac sodium (VOLTAREN) 1 % GEL, Apply 4 g topically 4 (four) times daily as needed (pain). (Patient not taking: Reported on 10/23/2017), Disp: 100 g, Rfl: 0 .  ibuprofen (ADVIL,MOTRIN) 600 MG tablet, Take 600 mg by mouth every 6 (six) hours as needed., Disp: , Rfl:   Physical exam:  Vitals:   10/23/17 1148 10/23/17 1201  BP: 123/87   Pulse: (!) 119   Resp: 18   Temp: (!) 97.3 F (36.3 C)   TempSrc: Tympanic   SpO2: 96%   Weight:  146 lb 4.8 oz (66.4 kg)  Height:  5' (1.524 m)   Physical Exam  Constitutional: She is oriented to person, place, and time. She appears well-developed and well-nourished.  She has baseline left-sided hemiplegia from prior stroke and has braces in place for her left lower extremity  HENT:  Head: Normocephalic and atraumatic.  Eyes: Pupils are equal, round, and reactive to light. EOM are normal.  Neck: Normal range of motion.  Cardiovascular: Normal rate, regular rhythm and normal heart sounds.  Pulmonary/Chest: Effort normal and breath sounds normal.  Abdominal: Soft. Bowel sounds are normal.  Ileostomy in place  Neurological: She is alert and oriented to person, place, and time.  Patient did walk a few steps when she got out of the wheelchair but she has a shuffling gait and is not able to bear weight completely on her right lower extremity  Skin: Skin is warm and dry.     CMP Latest Ref Rng & Units 10/02/2017  Glucose 70 - 99 mg/dL 131(H)  BUN 8 - 23  mg/dL 11  Creatinine 0.44 - 1.00 mg/dL 0.77  Sodium 135 - 145 mmol/L 140  Potassium 3.5 - 5.1 mmol/L 3.7  Chloride 98 - 111 mmol/L 105  CO2 22 - 32 mmol/L 23  Calcium 8.9 - 10.3 mg/dL 9.4  Total Protein 6.5 - 8.1 g/dL 7.2  Total Bilirubin 0.3 - 1.2 mg/dL 0.5  Alkaline Phos 38 - 126 U/L 141(H)  AST 15 - 41 U/L 37  ALT  0 - 44 U/L 30   CBC Latest Ref Rng & Units 10/02/2017  WBC 3.6 - 11.0 K/uL 8.2  Hemoglobin 12.0 - 16.0 g/dL 12.7  Hematocrit 35.0 - 47.0 % 36.7  Platelets 150 - 440 K/uL 412    No images are attached to the encounter.  Ct Chest W Contrast  Result Date: 10/03/2017 CLINICAL DATA:  History of stage III colon carcinoma diagnosed 01/23/2017. Status post colectomy. EXAM: CT CHEST, ABDOMEN, AND PELVIS WITH CONTRAST TECHNIQUE: Multidetector CT imaging of the chest, abdomen and pelvis was performed following the standard protocol during bolus administration of intravenous contrast. CONTRAST:  100 mL ISOVUE-300 IOPAMIDOL (ISOVUE-300) INJECTION 61% COMPARISON:  CT chest, abdomen and pelvis 04/08/2017. FINDINGS: CT CHEST FINDINGS Cardiovascular: Calcific aortic and coronary atherosclerosis is identified. Calcification of the mitral annulus is noted. Heart size is normal. Tiny amount of pericardial fluid noted. No aortic aneurysm. Mediastinum/Nodes: 1.4 cm low attenuating lesion in the right lobe of the thyroid is 0.7 cm low attenuating lesion in the left lobe of the thyroid are unchanged. No lymphadenopathy. Esophagus appears normal. Lungs/Pleura: No pleural effusion. No pulmonary nodule, mass or consolidation. Minimal scar atelectasis in the lingula noted. Musculoskeletal: No acute abnormality. No lytic or sclerotic lesion. CT ABDOMEN PELVIS FINDINGS Hepatobiliary: No focal lesion. Fatty infiltration of the liver is again seen. The gallbladder and biliary tree appear normal. Pancreas: Unremarkable. No pancreatic ductal dilatation or surrounding inflammatory changes. Spleen: Normal in size  without focal abnormality. Adrenals/Urinary Tract: A few tiny low attenuating lesions in the kidneys cannot be definitively characterized but are likely cysts and unchanged. The adrenal glands appear normal. Ureters and urinary bladder are unremarkable. The adrenal glands appear normal. Stomach/Bowel: Status post colectomy and right lower quadrant ileostomy. Otherwise negative. Vascular/Lymphatic: Aortic atherosclerosis. No enlarged abdominal or pelvic lymph nodes. Reproductive: Small uterine fibroid again seen.  No adnexal mass. Other: Small fat containing umbilical hernia noted. Musculoskeletal: A new destructive lesion in the left sacrum measures 5.3 cm craniocaudal by 3.2 cm AP x 3.2 cm transverse. The lesion extends into the left S1 foramen and could impact the S1 nerve root. No other focal bony lesion is identified. Degenerative disc disease in the upper and mid lumbar spine is noted. IMPRESSION: New large destructive lesion in the left sacrum is most consistent with metastatic colon cancer. The lesion encroaches on the left S1 nerve root. No other evidence of metastatic disease is identified in the chest, abdomen or pelvis. Calcific aortic and coronary atherosclerosis. Fatty infiltration of the liver. Status post colectomy without evidence of complication. Electronically Signed   By: Inge Rise M.D.   On: 10/03/2017 08:51   Ct Abdomen Pelvis W Contrast  Result Date: 10/03/2017 CLINICAL DATA:  History of stage III colon carcinoma diagnosed 01/23/2017. Status post colectomy. EXAM: CT CHEST, ABDOMEN, AND PELVIS WITH CONTRAST TECHNIQUE: Multidetector CT imaging of the chest, abdomen and pelvis was performed following the standard protocol during bolus administration of intravenous contrast. CONTRAST:  100 mL ISOVUE-300 IOPAMIDOL (ISOVUE-300) INJECTION 61% COMPARISON:  CT chest, abdomen and pelvis 04/08/2017. FINDINGS: CT CHEST FINDINGS Cardiovascular: Calcific aortic and coronary atherosclerosis is  identified. Calcification of the mitral annulus is noted. Heart size is normal. Tiny amount of pericardial fluid noted. No aortic aneurysm. Mediastinum/Nodes: 1.4 cm low attenuating lesion in the right lobe of the thyroid is 0.7 cm low attenuating lesion in the left lobe of the thyroid are unchanged. No lymphadenopathy. Esophagus appears normal. Lungs/Pleura: No pleural effusion. No pulmonary nodule, mass  or consolidation. Minimal scar atelectasis in the lingula noted. Musculoskeletal: No acute abnormality. No lytic or sclerotic lesion. CT ABDOMEN PELVIS FINDINGS Hepatobiliary: No focal lesion. Fatty infiltration of the liver is again seen. The gallbladder and biliary tree appear normal. Pancreas: Unremarkable. No pancreatic ductal dilatation or surrounding inflammatory changes. Spleen: Normal in size without focal abnormality. Adrenals/Urinary Tract: A few tiny low attenuating lesions in the kidneys cannot be definitively characterized but are likely cysts and unchanged. The adrenal glands appear normal. Ureters and urinary bladder are unremarkable. The adrenal glands appear normal. Stomach/Bowel: Status post colectomy and right lower quadrant ileostomy. Otherwise negative. Vascular/Lymphatic: Aortic atherosclerosis. No enlarged abdominal or pelvic lymph nodes. Reproductive: Small uterine fibroid again seen.  No adnexal mass. Other: Small fat containing umbilical hernia noted. Musculoskeletal: A new destructive lesion in the left sacrum measures 5.3 cm craniocaudal by 3.2 cm AP x 3.2 cm transverse. The lesion extends into the left S1 foramen and could impact the S1 nerve root. No other focal bony lesion is identified. Degenerative disc disease in the upper and mid lumbar spine is noted. IMPRESSION: New large destructive lesion in the left sacrum is most consistent with metastatic colon cancer. The lesion encroaches on the left S1 nerve root. No other evidence of metastatic disease is identified in the chest,  abdomen or pelvis. Calcific aortic and coronary atherosclerosis. Fatty infiltration of the liver. Status post colectomy without evidence of complication. Electronically Signed   By: Inge Rise M.D.   On: 10/03/2017 08:51     Assessment and plan- Patient is a 62 y.o. female with history of stage III colon cancer in August 2018 and status post 5 cycles of adjuvant FOLFOX.  She could not complete further chemotherapy due to significant complications.  Recent surveillance scans showed evidence of large sacral mass concerning for metastases.  She is here to discuss further goals of care  1.  Sacral metastases.  This is causing nerve impingement as well as pain in her tailbone.  She is current currently undergoing palliative radiation treatment and is also taking PRN oxycodone for her pain.  I discussed with the patient and her family members that it could take a couple of weeks for the radiation to work and for her to feel better from the pain.  However radiation sometimes not pain away.  We will have to continue to work with her narcotic pain medications for better pain control.  She does report somnolence with oxycodone and was hesitant to take it.  I have advised that somnolence usually gets better and body eventually develops tolerance.  She should therefore try to take her pain medications if she is in pain and we can continue to titrate her pain medications to her comfort levels.  We may have to consider adding a long-acting pain medicine as well down the line  2.  We did obtain outside slides from her colon cancer surgery.  Pathology was reviewed at our institution and was confirmed to be invasive adenocarcinoma with greater than 50% signet cell histology.  Given that she has developed a sacral mass within a year of her colon cancer this is consistent with metastatic colon cancer and I will hold off on getting a repeat sacral biopsy at this time especially since she is getting radiation treatment to  that area and biopsy may even be falsely negative.  Patient and her family is in understanding  3.  I also discussed with the family that the fact that she has sacral  metastases she does need systemic chemotherapy at this time and radiation will only help palliate her pain but does not treat colon cancer is at home.  She did go through 5 cycles of FOLFOX in the past but subsequently had complications leading to hospitalization and had to stop chemotherapy.  I would recommend proceeding with palliative FOLFIRI at this time instead of FOLFOX given that patient already has a baseline stroke and is having more trouble ambulating.  Oxaliplatin can worsen neuropathy and I would therefore prefer irinotecan over oxaliplatin.  I will also order additional testing for comprehensive Ras panel on her tumor specimen to see if she would be a candidate for cetuximab or Panitumumab.  Given her prior history of stroke, I will hold off on using a Avastin at this time.  4.  I also discussed with the family that chemotherapy in stage IV colon cancer is continued until progression or toxicity.  Given her issues tolerating chemotherapy in the past as well as her present performance status due to stroke we could consider giving her a chemotherapy break down the line if she has good response to treatment.  Treatment will be given with the palliative intent.  Discussed risks and benefits of FOLFIRI including all but not limited to nausea, vomiting, low blood counts, risk of hospitalization and infection.  Risk of diarrhea associated with irinotecan.  Patient understands and agrees to proceed.  I will wait for her to finish her palliative radiation treatment.  She will need a port placement for chemotherapy which will be coordinated by Dr. Dahlia Byes.  I will tentatively start her chemotherapy in 2 weeks time and see her on that day with a CBC CMP and a CEA  5.  Gait imbalance which is gradually getting worse over the last couple of months.   I suspect this is secondary to her sacral metastases which is causing nerve impingement.  I will have however get a bone scan as well as an MRI brain to evaluate for any other lesions.   Total face to face encounter time for this patient visit was 45 min. >50% of the time was  spent in counseling and coordination of care.     Visit Diagnosis 1. Malignant neoplasm of colon, unspecified part of colon ([place])   2. Bone metastases ([place])   3. Goals of care, counseling/discussion      Dr. Randa Evens, MD, MPH [facility] [phone] 10/26/2017 8:25 AM

## 2017-10-27 ENCOUNTER — Ambulatory Visit: Payer: BLUE CROSS/BLUE SHIELD | Admitting: Physical Therapy

## 2017-10-27 ENCOUNTER — Ambulatory Visit
Admission: RE | Admit: 2017-10-27 | Discharge: 2017-10-27 | Disposition: A | Discharge status: Home / Self Care | Payer: BLUE CROSS/BLUE SHIELD | Source: Ambulatory Visit | Attending: Radiation Oncology | Admitting: Radiation Oncology

## 2017-10-27 DIAGNOSIS — C7951 Secondary malignant neoplasm of bone: Secondary | ICD-10-CM | POA: Diagnosis not present

## 2017-10-28 ENCOUNTER — Telehealth: Payer: Self-pay | Admitting: *Deleted

## 2017-10-28 ENCOUNTER — Other Ambulatory Visit
Admission: RE | Admit: 2017-10-28 | Discharge: 2017-10-28 | Disposition: A | Discharge status: Home / Self Care | Payer: BLUE CROSS/BLUE SHIELD | Source: Ambulatory Visit | Attending: Surgery | Admitting: Surgery

## 2017-10-28 ENCOUNTER — Ambulatory Visit
Admission: RE | Admit: 2017-10-28 | Discharge: 2017-10-28 | Disposition: A | Discharge status: Home / Self Care | Payer: BLUE CROSS/BLUE SHIELD | Source: Ambulatory Visit | Attending: Radiation Oncology | Admitting: Radiation Oncology

## 2017-10-28 ENCOUNTER — Ambulatory Visit (INDEPENDENT_AMBULATORY_CARE_PROVIDER_SITE_OTHER): Payer: BLUE CROSS/BLUE SHIELD | Admitting: Surgery

## 2017-10-28 ENCOUNTER — Encounter: Payer: Self-pay | Admitting: Surgery

## 2017-10-28 VITALS — BP 118/78 | HR 128 | Temp 97.9°F | Resp 18 | Ht 60.0 in | Wt 157.0 lb

## 2017-10-28 DIAGNOSIS — C189 Malignant neoplasm of colon, unspecified: Secondary | ICD-10-CM | POA: Diagnosis present

## 2017-10-28 DIAGNOSIS — C187 Malignant neoplasm of sigmoid colon: Secondary | ICD-10-CM

## 2017-10-28 DIAGNOSIS — C7951 Secondary malignant neoplasm of bone: Secondary | ICD-10-CM | POA: Diagnosis not present

## 2017-10-28 LAB — APTT: aPTT: 32 seconds (ref 24–36)

## 2017-10-28 LAB — PROTIME-INR
INR: 1.02
Prothrombin Time: 13.3 seconds (ref 11.4–15.2)

## 2017-10-28 NOTE — Telephone Encounter (Signed)
Patient's surgery has been scheduled for 11-03-17 at [facility] with Dr. Dahlia Byes.   The patient's husband has been notified of this as well as Pre-admit appointment date and time. He verbalizes understanding.

## 2017-10-28 NOTE — Progress Notes (Signed)
Surgical Consultation  10/28/2017  Theresa Brock is an 62 y.o. female.   Chief Complaint  Patient presents with  . Pre-op Exam    port placement to start chemo on 11-10-17     HPI: Theresa Brock referred back from Dr. Janese Banks after new metastases were found.  She did have a history of colon cancer status post resection and ileostomy.  She is debilitated and has some residual sequela from a previous stroke.  Relief for a few feet with significant assistance.  Is been is a caregiver. Removed her port 5 months ago without any issues.  Port was placed in an outside hospital. 2 ?6 normal hemoglobin and platelets.  CMP is normal.   Past Medical History:  Diagnosis Date  . Colon cancer ([place])   . Diplopia 07/05/2014   Overview:  Converted from Centricity: Description - DIPLOPIA  . Dysphagia, oropharyngeal phase 09/20/2013   Overview:  Converted from Centricity: Description - DYSPHAGIA, OROPHARYNGEAL PHASE  . Essential (primary) hypertension 09/20/2013   Overview:  Converted from Centricity: Description - HYPERTENSION, BENIGN ESSENTIAL  . GERD (gastroesophageal reflux disease)   . Hyperlipidemia   . Hypertension   . Malignant neoplasm of colon ([place]) 01/23/2017  . Stroke ([place])   . Tinea unguium 09/20/2013   Overview:  Converted from Centricity: Description - ONYCHOMYCOSIS, TOENAILS    Past Surgical History:  Procedure Laterality Date  . COLOSTOMY    . FLEXIBLE SIGMOIDOSCOPY N/A 05/14/2017   Procedure: FLEXIBLE SIGMOIDOSCOPY;  Surgeon: Lin Landsman, MD;  Location: [facility] ENDOSCOPY;  Service: Gastroenterology;  Laterality: N/A;  . PORT A CATH INJECTION ([place] HX) Right    7/18    Family History  Problem Relation Age of Onset  . Throat cancer Brother   . Stroke Mother   . Lung cancer Father     Social History:  reports that she has never smoked. She has never used smokeless tobacco. She reports that she does not drink alcohol or use drugs.  Allergies: No Known Allergies  Medications  reviewed.     ROS Full ROS performed and is otherwise negative other than what is stated in the HPI    BP 118/78   Pulse (!) 128   Temp 97.9 F (36.6 C) (Skin)   Resp 18   Ht 5' (1.524 m)   Wt 157 lb (71.2 kg)   SpO2 95%   BMI 30.66 kg/m   Physical Exam  Constitutional: She is oriented to person, place, and time. She appears well-developed and well-nourished. No distress.  Neck: Normal range of motion. Neck supple. No tracheal deviation present. No thyromegaly present.  Cardiovascular: Normal rate and regular rhythm.  Pulmonary/Chest: Effort normal and breath sounds normal. No stridor. No respiratory distress. She has no wheezes.  Abdominal: Soft. She exhibits no distension and no mass. There is no tenderness. There is no guarding.  Stoma in place and working  Neurological: She is alert and oriented to person, place, and time. She displays normal reflexes. No cranial nerve deficit or sensory deficit. She exhibits normal muscle tone. Coordination normal.  Skin: Skin is warm and dry. Capillary refill takes less than 2 seconds. She is not diaphoretic.  Psychiatric: She has a normal mood and affect. Her behavior is normal. Judgment and thought content normal.  Nursing note and vitals reviewed.  Assessment/Plan: Metastatic colon cancer in need for adjuvant chemotherapy and port placement.  I will obtain recent coags including INR and PTT.  Discussed with patient detail about the procedure.  Risk benefit and possible complications including but not limited to: Bleeding, infection, pneumothorax, vascular injury they understand and wish to proceed.   Caroleen Hamman, MD [facility] General Surgeon

## 2017-10-28 NOTE — Patient Instructions (Signed)
PTT and INR at [facility] An implanted port is a type of central line that is placed under the skin. Central lines are used to provide IV access when treatment or nutrition needs to be given through a person's veins. Implanted ports are used for long-term IV access. An implanted port may be placed because:  You need IV medicine that would be irritating to the small veins in your hands or arms.  You need long-term IV medicines, such as antibiotics.  You need IV nutrition for a long period.  You need frequent blood draws for lab tests.  You need dialysis.  Implanted ports are usually placed in the chest area, but they can also be placed in the upper arm, the abdomen, or the leg. An implanted port has two main parts:  Reservoir. The reservoir is round and will appear as a small, raised area under your skin. The reservoir is the part where a needle is inserted to give medicines or draw blood.  Catheter. The catheter is a thin, flexible tube that extends from the reservoir. The catheter is placed into a large vein. Medicine that is inserted into the reservoir goes into the catheter and then into the vein.  How will I care for my incision site? Do not get the incision site wet. Bathe or shower as directed by your health care provider. How is my port accessed? Special steps must be taken to access the port:  Before the port is accessed, a numbing cream can be placed on the skin. This helps numb the skin over the port site.  Your health care provider uses a sterile technique to access the port. ? Your health care provider must put on a mask and sterile gloves. ? The skin over your port is cleaned carefully with an antiseptic and allowed to dry. ? The port is gently pinched between sterile gloves, and a needle is inserted into the port.  Only "non-coring" port needles should be used to access the port. Once the port is accessed, a blood return should be checked. This  helps ensure that the port is in the vein and is not clogged.  If your port needs to remain accessed for a constant infusion, a clear (transparent) bandage will be placed over the needle site. The bandage and needle will need to be changed every week, or as directed by your health care provider.  Keep the bandage covering the needle clean and dry. Do not get it wet. Follow your health care provider's instructions on how to take a shower or bath while the port is accessed.  If your port does not need to stay accessed, no bandage is needed over the port.  What is flushing? Flushing helps keep the port from getting clogged. Follow your health care provider's instructions on how and when to flush the port. Ports are usually flushed with saline solution or a medicine called heparin. The need for flushing will depend on how the port is used.  If the port is used for intermittent medicines or blood draws, the port will need to be flushed: ? After medicines have been given. ? After blood has been drawn. ? As part of routine maintenance.  If a constant infusion is running, the port may not need to be flushed.  How long will my port stay implanted? The port can stay in for as long as your health care provider thinks it is needed. When it is time for the port  to come out, surgery will be done to remove it. The procedure is similar to the one performed when the port was put in. When should I seek immediate medical care? When you have an implanted port, you should seek immediate medical care if:  You notice a bad smell coming from the incision site.  You have swelling, redness, or drainage at the incision site.  You have more swelling or pain at the port site or the surrounding area.  You have a fever that is not controlled with medicine.  This information is not intended to replace advice given to you by your health care provider. Make sure you discuss any questions you have with your health care  provider. Document Released: 01/20/2005 Document Revised: 06/28/2015 Document Reviewed: 09/27/2012 Elsevier Interactive Patient Education  2017 [employer].

## 2017-10-29 ENCOUNTER — Ambulatory Visit
Admission: RE | Admit: 2017-10-29 | Discharge: 2017-10-29 | Disposition: A | Discharge status: Home / Self Care | Payer: BLUE CROSS/BLUE SHIELD | Source: Ambulatory Visit | Attending: Radiation Oncology | Admitting: Radiation Oncology

## 2017-10-29 ENCOUNTER — Ambulatory Visit: Payer: BLUE CROSS/BLUE SHIELD | Admitting: Physical Therapy

## 2017-10-29 ENCOUNTER — Other Ambulatory Visit: Payer: Self-pay

## 2017-10-29 DIAGNOSIS — C7951 Secondary malignant neoplasm of bone: Secondary | ICD-10-CM | POA: Diagnosis not present

## 2017-10-29 MED ORDER — MORPHINE SULFATE 15 MG PO TABS: 15.0000 mg | ORAL_TABLET | Freq: Four times a day (QID) | ORAL | 0 refills | Status: DC | PRN

## 2017-10-30 ENCOUNTER — Other Ambulatory Visit: Payer: Self-pay

## 2017-10-30 ENCOUNTER — Ambulatory Visit
Admission: RE | Admit: 2017-10-30 | Discharge: 2017-10-30 | Disposition: A | Discharge status: Home / Self Care | Payer: BLUE CROSS/BLUE SHIELD | Source: Ambulatory Visit | Attending: Radiation Oncology | Admitting: Radiation Oncology

## 2017-10-30 ENCOUNTER — Encounter
Admission: RE | Admit: 2017-10-30 | Discharge: 2017-10-30 | Disposition: A | Discharge status: Home / Self Care | Payer: BLUE CROSS/BLUE SHIELD | Source: Ambulatory Visit | Attending: Surgery | Admitting: Surgery

## 2017-10-30 DIAGNOSIS — I10 Essential (primary) hypertension: Secondary | ICD-10-CM | POA: Insufficient documentation

## 2017-10-30 DIAGNOSIS — Z01818 Encounter for other preprocedural examination: Secondary | ICD-10-CM | POA: Diagnosis not present

## 2017-10-30 DIAGNOSIS — R000 Tachycardia, unspecified: Secondary | ICD-10-CM | POA: Insufficient documentation

## 2017-10-30 DIAGNOSIS — E785 Hyperlipidemia, unspecified: Secondary | ICD-10-CM | POA: Insufficient documentation

## 2017-10-30 DIAGNOSIS — C7951 Secondary malignant neoplasm of bone: Secondary | ICD-10-CM | POA: Diagnosis not present

## 2017-10-30 NOTE — Patient Instructions (Signed)
Your procedure is scheduled on: Tuesday 11/03/17 Report to [facility]. To find out your arrival time please call [phone] between 1PM - 3PM on Monday 11/02/17.  Remember: Instructions that are not followed completely may result in serious medical risk, up to and including death, or upon the discretion of your surgeon and anesthesiologist your surgery may need to be rescheduled.     _X__ 1. Do not eat food after midnight the night before your procedure.                 No gum chewing or hard candies. You may drink clear liquids up to 2 hours                 before you are scheduled to arrive for your surgery- DO not drink clear                 liquids within 2 hours of the start of your surgery.                 Clear Liquids include:  water, apple juice without pulp, clear carbohydrate                 drink such as Clearfast or Gatorade, Black Coffee or Tea (Do not add                 anything to coffee or tea).  __X__2.  On the morning of surgery brush your teeth with toothpaste and water, you                 may rinse your mouth with mouthwash if you wish.  Do not swallow any              toothpaste of mouthwash.     _X__ 3.  No Alcohol for 24 hours before or after surgery.   _X__ 4.  Do Not Smoke or use e-cigarettes For 24 Hours Prior to Your Surgery.                 Do not use any chewable tobacco products for at least 6 hours prior to                 surgery.  ____  5.  Bring all medications with you on the day of surgery if instructed.   __X__  6.  Notify your doctor if there is any change in your medical condition      (cold, fever, infections).     Do not wear jewelry, make-up, hairpins, clips or nail polish. Do not wear lotions, powders, or perfumes.  Do not shave 48 hours prior to surgery. Men may shave face and neck. Do not bring valuables to the hospital.    [facility] is not responsible for any belongings or  valuables.  Contacts, dentures/partials or body piercings may not be worn into surgery. Bring a case for your contacts, glasses or hearing aids, a denture cup will be supplied. Leave your suitcase in the car. After surgery it may be brought to your room. For patients admitted to the hospital, discharge time is determined by your treatment team.   Patients discharged the day of surgery will not be allowed to drive home.   Please read over the following fact sheets that you were given:   MRSA Information  __X__ Take these medicines the morning of surgery with A SIP OF WATER:  1. none  2. May take pain medicine if needed  3.   4.  5.  6.  ____ Fleet Enema (as directed)   __X__ Use CHG Soap/SAGE wipes as directed  ____ Use inhalers on the day of surgery  ____ Stop metformin/Janumet/Farxiga 2 days prior to surgery    ____ Take 1/2 of usual insulin dose the night before surgery. No insulin the morning          of surgery.   ____ Stop Blood Thinners Coumadin/Plavix/Xarelto/Pleta/Pradaxa/Eliquis/Effient/Aspirin  on   Or contact your Surgeon, Cardiologist or Medical Doctor regarding  ability to stop your blood thinners  __X__ Stop Anti-inflammatories 7 days before surgery such as Advil, Ibuprofen, Motrin,  BC or Goodies Powder, Naprosyn, Naproxen, Aleve, Aspirin    __X__ Stop all herbal supplements, fish oil or vitamin E until after surgery.    ____ Bring C-Pap to the hospital.

## 2017-11-02 ENCOUNTER — Ambulatory Visit
Admission: RE | Admit: 2017-11-02 | Discharge: 2017-11-02 | Disposition: A | Discharge status: Home / Self Care | Payer: BLUE CROSS/BLUE SHIELD | Source: Ambulatory Visit | Attending: Oncology | Admitting: Oncology

## 2017-11-02 ENCOUNTER — Other Ambulatory Visit: Payer: Self-pay

## 2017-11-02 ENCOUNTER — Encounter: Payer: Self-pay | Admitting: Nurse Practitioner

## 2017-11-02 ENCOUNTER — Ambulatory Visit: Payer: BLUE CROSS/BLUE SHIELD

## 2017-11-02 ENCOUNTER — Ambulatory Visit: Admission: RE | Admit: 2017-11-02 | Payer: BLUE CROSS/BLUE SHIELD | Source: Ambulatory Visit

## 2017-11-02 ENCOUNTER — Inpatient Hospital Stay (HOSPITAL_BASED_OUTPATIENT_CLINIC_OR_DEPARTMENT_OTHER): Payer: BLUE CROSS/BLUE SHIELD | Admitting: Nurse Practitioner

## 2017-11-02 ENCOUNTER — Inpatient Hospital Stay: Payer: BLUE CROSS/BLUE SHIELD

## 2017-11-02 ENCOUNTER — Other Ambulatory Visit: Payer: Self-pay | Admitting: *Deleted

## 2017-11-02 VITALS — BP 133/93 | HR 120 | Temp 97.0°F | Resp 22

## 2017-11-02 DIAGNOSIS — C189 Malignant neoplasm of colon, unspecified: Secondary | ICD-10-CM

## 2017-11-02 DIAGNOSIS — R531 Weakness: Secondary | ICD-10-CM

## 2017-11-02 DIAGNOSIS — R7989 Other specified abnormal findings of blood chemistry: Secondary | ICD-10-CM

## 2017-11-02 DIAGNOSIS — C7951 Secondary malignant neoplasm of bone: Secondary | ICD-10-CM

## 2017-11-02 DIAGNOSIS — M25511 Pain in right shoulder: Secondary | ICD-10-CM | POA: Diagnosis present

## 2017-11-02 DIAGNOSIS — Z7189 Other specified counseling: Secondary | ICD-10-CM

## 2017-11-02 DIAGNOSIS — K5903 Drug induced constipation: Secondary | ICD-10-CM | POA: Diagnosis not present

## 2017-11-02 DIAGNOSIS — T402X5A Adverse effect of other opioids, initial encounter: Secondary | ICD-10-CM

## 2017-11-02 DIAGNOSIS — Z9221 Personal history of antineoplastic chemotherapy: Secondary | ICD-10-CM

## 2017-11-02 DIAGNOSIS — Z79899 Other long term (current) drug therapy: Secondary | ICD-10-CM

## 2017-11-02 DIAGNOSIS — G893 Neoplasm related pain (acute) (chronic): Secondary | ICD-10-CM | POA: Diagnosis not present

## 2017-11-02 LAB — CBC WITH DIFFERENTIAL/PLATELET
Basophils Absolute: 0.1 10*3/uL (ref 0–0.1)
Basophils Relative: 1 %
Eosinophils Absolute: 0.4 10*3/uL (ref 0–0.7)
Eosinophils Relative: 3 %
HEMATOCRIT: 32.2 % — AB (ref 35.0–47.0)
HEMOGLOBIN: 10.9 g/dL — AB (ref 12.0–16.0)
Lymphocytes Relative: 5 %
Lymphs Abs: 0.7 10*3/uL — ABNORMAL LOW (ref 1.0–3.6)
MCH: 31.4 pg (ref 26.0–34.0)
MCHC: 33.9 g/dL (ref 32.0–36.0)
MCV: 92.6 fL (ref 80.0–100.0)
Monocytes Absolute: 0.9 10*3/uL (ref 0.2–0.9)
Monocytes Relative: 7 %
NEUTROS ABS: 11.2 10*3/uL — AB (ref 1.4–6.5)
Neutrophils Relative %: 84 %
Platelets: 353 10*3/uL (ref 150–440)
RBC: 3.48 MIL/uL — ABNORMAL LOW (ref 3.80–5.20)
RDW: 13 % (ref 11.5–14.5)
WBC: 13.2 10*3/uL — AB (ref 3.6–11.0)

## 2017-11-02 LAB — MAGNESIUM: Magnesium: 1.7 mg/dL (ref 1.7–2.4)

## 2017-11-02 LAB — COMPREHENSIVE METABOLIC PANEL
ALBUMIN: 3.3 g/dL — AB (ref 3.5–5.0)
ALK PHOS: 346 U/L — AB (ref 38–126)
ALT: 34 U/L (ref 0–44)
ANION GAP: 13 (ref 5–15)
AST: 68 U/L — ABNORMAL HIGH (ref 15–41)
BILIRUBIN TOTAL: 1.2 mg/dL (ref 0.3–1.2)
BUN: 29 mg/dL — AB (ref 8–23)
CALCIUM: 9.9 mg/dL (ref 8.9–10.3)
CO2: 20 mmol/L — ABNORMAL LOW (ref 22–32)
Chloride: 102 mmol/L (ref 98–111)
Creatinine, Ser: 1.19 mg/dL — ABNORMAL HIGH (ref 0.44–1.00)
GFR calc Af Amer: 55 mL/min — ABNORMAL LOW (ref >60)
GFR, EST NON AFRICAN AMERICAN: 47 mL/min — AB (ref >60)
GLUCOSE: 135 mg/dL — AB (ref 70–99)
Potassium: 3.9 mmol/L (ref 3.5–5.1)
Sodium: 135 mmol/L (ref 135–145)
TOTAL PROTEIN: 7.4 g/dL (ref 6.5–8.1)

## 2017-11-02 MED ORDER — TECHNETIUM TC 99M MEDRONATE IV KIT
20.0000 | PACK | Freq: Once | INTRAVENOUS | Status: AC | PRN
  Administered 2017-11-02: 23.273 via INTRAVENOUS

## 2017-11-02 NOTE — Progress Notes (Signed)
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  She was diagnosed with stage IIIc adenocarcinoma of the colon in May 2018 in [place].  She had presented with abdominal pain nausea vomiting and intermittent rectal bleeding in April 2018 and was found to have small bowel obstruction at that time.  Patient had a total colectomy on 06/04/2016 pathology showed invasive adenocarcinoma ring-type involving the cecum and the ascending colon. Tubulovillous adenoma with high-grade dysplasia in the ascending colon. 2 small tubular adenomas in the ascending colon. One inflammatory polyp in the ascending colon. Serrated adenoma in the transverse colon. 8 out of 19 pericolonic lymph nodes were positive for tumor. No loss of nuclear expression of MMR proteins. Tumor was poorly differentiated and invades through the muscularis propria into pericolonic tissue. No microscopy perforation. L VIN PNI were present. Margins were negative. pT3pN2b  Patient had a complicated postoperative course following the colectomy with postoperative ileus C. difficile colitis and was admitted in the ICU for septic shock with pressors. There was a considerable delay because of that in starting adjuvant chemotherapy. CBC back in July 2018 was normal with a white count of 10.7, H&H of 12/37 and a platelet count of 445. LFTs were within normal limits and serum creatinine was 0.6. CT chest abdomen and pelvis in July  2018 did not reveal any evidence of recurrent or metastatic disease.  Patient was also seen by GYN for thickened endometrium and underwent transvaginal ultrasound in August 2018 which showed a 2.8 cm fibroid as well as endometrial thickening of 12.8 mm. Plan was for hysteroscopy and D&C in September 2018  Adjuvant chemotherapy with FOLFOX was planned every 2 weeks for 12 cycles which started in August 2018. Adjuvant chemotherapy was initiated on 09/30/2016 and from review of records so far I see that her last chemotherapy was cycle #5 that was given on 11/27/2016 after that chemo patient was admitted to the hospital on 12/07/2016 with renal failure and failure to thrive. Patient did not desire further chemo at that point.   Patient lives in an [place] and travels between [place] and [place].  Per patient, she returned to baseline after stopping chemotherapy in October 2018 and has been on surveillance since that time.  10/02/2017-CT imaging revealed new large 5.3 cm destructive lesion in the left sacrum consistent with metastatic colon cancer.  Lesion encroaches on the left S1 nerve root.  No other evidence of metastatic disease identified in the chest, abdomen, or pelvis.  She was experiencing pain and was evaluated by radiation oncology for palliative radiation.    Malignant neoplasm of colon ([place])   01/23/2017 Initial Diagnosis    Malignant neoplasm of colon ([place])    10/25/2017 -  Chemotherapy    The patient had PALONOSETRON HCL INJECTION 0.25 MG/5ML, 0.25 mg, Intravenous,  Once, 0 of 4 cycles irinotecan (CAMPTOSAR) 300 mg in dextrose 5 % 500 mL chemo infusion, 180 mg/m2, Intravenous,  Once, 0 of 4 cycles leucovorin 672 mg in dextrose 5 % 250 mL infusion, 400 mg/m2, Intravenous,  Once, 0  of 4 cycles fluorouracil (ADRUCIL) chemo injection 650 mg, 400 mg/m2, Intravenous,  Once, 0 of 4 cycles fluorouracil (ADRUCIL) 4,050 mg in sodium chloride 0.9 % 69 mL chemo infusion, 2,400 mg/m2, Intravenous, 1  Day/Dose, 0 of 4 cycles  for chemotherapy treatment.      Bone metastases ([place])   10/25/2017 -  Chemotherapy    The patient had PALONOSETRON HCL INJECTION 0.25 MG/5ML, 0.25 mg, Intravenous,  Once, 0 of 4 cycles irinotecan (CAMPTOSAR) 300 mg in dextrose 5 % 500 mL chemo infusion, 180 mg/m2, Intravenous,  Once, 0 of 4 cycles leucovorin 672 mg in dextrose 5 % 250 mL infusion, 400 mg/m2, Intravenous,  Once, 0 of 4 cycles fluorouracil (ADRUCIL) chemo injection 650 mg, 400 mg/m2, Intravenous,  Once, 0 of 4 cycles fluorouracil (ADRUCIL) 4,050 mg in sodium chloride 0.9 % 69 mL chemo infusion, 2,400 mg/m2, Intravenous, 1 Day/Dose, 0 of 4 cycles  for chemotherapy treatment.     10/26/2017 Initial Diagnosis    Bone metastases ([facility])     Interval history- Theresa Brock, 62 year old female, with above history of metastatic colon cancer.  She localizes pain to right femur and sacrum and rates her pain 8 of 10.  She has pain at rest and when weightbearing.  Mobility is limited by her pain.  She has been taking Tylenol and oxycodone for her pain with minimal improvement and was recently started on morphine which she says makes her too sleepy and causes hypotension (70s/50s).  She has pain throughout the day and night and describes it as sharp to aching.  Pain is made worse by exertion and improves at rest intermittently.  She denies trauma.  She is in a wheelchair at baseline but normally is able to stand and walk a few steps with her braces but more recently has been able to stand and bear weight on her right leg without significant pain.  She has a bone scan scheduled today for evaluation of this pain.  She is currently receiving radiation to her sacrum and does not believe this is improving her pain.    ECOG FS:3 - Symptomatic, >50% confined to bed  Review of systems- Review of Systems  Constitutional: Positive for malaise/fatigue. Negative for chills, fever and weight loss.  HENT: Negative for congestion,  ear discharge, ear pain, sinus pain, sore throat and tinnitus.   Eyes: Negative.   Respiratory: Negative.  Negative for cough, sputum production and shortness of breath.   Cardiovascular: Negative for chest pain, palpitations, orthopnea, claudication and leg swelling.  Gastrointestinal: Negative for abdominal pain, blood in stool, constipation, diarrhea, heartburn, nausea and vomiting.  Genitourinary: Negative.   Musculoskeletal: Positive for back pain, joint pain and myalgias.  Skin: Negative.   Neurological: Negative for dizziness, tingling, weakness and headaches.  Endo/Heme/Allergies: Negative.   Psychiatric/Behavioral: Negative.      Current treatment- consideration of initiating FOLFIRI on   No Known Allergies  Past Medical History:  Diagnosis Date  . Colon cancer ([place])   . Diplopia 07/05/2014   Overview:  Converted from [facility]: Description - DIPLOPIA  . Dysphagia, oropharyngeal phase 09/20/2013   Overview:  Converted from Centricity: Description - DYSPHAGIA, OROPHARYNGEAL PHASE  . Essential (primary) hypertension 09/20/2013   Overview:  Converted from Centricity: Description - HYPERTENSION, BENIGN ESSENTIAL  . GERD (gastroesophageal reflux disease)   . Hyperlipidemia   . Hypertension   . Malignant neoplasm of colon ([place]) 01/23/2017  . Stroke ([place])   . Tinea unguium 09/20/2013   Overview:  Converted from Centricity: Description - ONYCHOMYCOSIS, TOENAILS    Past Surgical History:  Procedure Laterality Date  . COLOSTOMY    . FLEXIBLE SIGMOIDOSCOPY N/A 05/14/2017   Procedure: FLEXIBLE SIGMOIDOSCOPY;  Surgeon: Lin Landsman, MD;  Location: [facility] ENDOSCOPY;  Service: Gastroenterology;  Laterality: N/A;  . PORT A CATH INJECTION ([place] [facility]) Right    7/18    Social History   Socioeconomic History  . Marital status: Married    Spouse name: Not on file  . Number of children: Not on file  . Years of education: Not on file  . Highest education level: Not on file    Occupational History  . Not on file  Social Needs  . Financial resource strain: Not on file  . Food insecurity:    Worry: Not on file    Inability: Not on file  . Transportation needs:    Medical: Not on file    Non-medical: Not on file  Tobacco Use  . Smoking status: Never Smoker  . Smokeless tobacco: Never Used  Substance and Sexual Activity  . Alcohol use: No  . Drug use: No  . Sexual activity: Not on file  Lifestyle  . Physical activity:    Days per week: Not on file    Minutes per session: Not on file  . Stress: Not on file  Relationships  . Social connections:    Talks on phone: Not on file    Gets together: Not on file    Attends religious service: Not on file    Active member of club or organization: Not on file    Attends meetings of clubs or organizations: Not on file    Relationship status: Not on file  . Intimate partner violence:    Fear of current or ex partner: Not on file    Emotionally abused: Not on file    Physically abused: Not on file    Forced sexual activity: Not on file  Other Topics Concern  . Not on file  Social History Narrative  . Not on file    Family History  Problem Relation Age of Onset  . Throat cancer Brother   . Stroke Mother   . Lung cancer Father      Current Outpatient Medications:  .  acetaminophen (TYLENOL) 325 MG tablet, Take 650 mg by mouth every 6 (six) hours as needed., Disp: , Rfl:  .  atorvastatin (LIPITOR) 20 MG tablet, Take 1 tablet (20 mg total) by mouth daily at 6 PM., Disp: 90 tablet, Rfl: 1 .  losartan (COZAAR) 25 MG tablet, Take 1 tablet (25 mg total) by mouth daily., Disp: 90 tablet, Rfl: 1 .  Multiple Vitamin (MULTIVITAMIN WITH MINERALS) TABS tablet, Take 1 tablet by mouth daily., Disp: , Rfl:  .  dexamethasone (DECADRON) 4 MG tablet, Take 2 tablets (8 mg total) by mouth daily. Start the day after chemo for 2 days. (Patient not taking: Reported on 10/28/2017), Disp: 8 tablet, Rfl: 5 .  loperamide (IMODIUM  A-D) 2 MG tablet, Take 1 tablet (2 mg total) by mouth 3 (three) times daily as needed. Take 2 at onset , then 1 every 2hr with each BM. Max dose 16 mg (Patient not taking: Reported on 10/28/2017), Disp: 100 tablet, Rfl: 1 .  LORazepam (ATIVAN) 1 MG tablet, Take 1 tablet (1 mg total) by mouth every 6 (six) hours as needed (NAUSEA). (Patient not taking: Reported on 10/28/2017), Disp: 30 tablet, Rfl: 0 .  morphine (MSIR)  15 MG tablet, Take 1 tablet (15 mg total) by mouth every 6 (six) hours as needed for up to 14 days for severe pain. (Patient not taking: Reported on 11/02/2017), Disp: 56 tablet, Rfl: 0 .  ondansetron (ZOFRAN) 8 MG tablet, Take 1 tablet (8 mg total) by mouth 2 (two) times daily as needed for refractory nausea / vomiting. Start on day 3 after chemotherapy. (Patient not taking: Reported on 10/28/2017), Disp: 30 tablet, Rfl: 1 .  oxyCODONE (ROXICODONE) 5 MG immediate release tablet, Take 1 tablet (5 mg total) by mouth every 6 (six) hours as needed for severe pain. (Patient not taking: Reported on 11/02/2017), Disp: 60 tablet, Rfl: 0 .  prochlorperazine (COMPAZINE) 10 MG tablet, Take 1 tablet (10 mg total) by mouth every 6 (six) hours as needed (NAUSEA). (Patient not taking: Reported on 10/28/2017), Disp: 30 tablet, Rfl: 1  Physical exam:  Vitals:   11/02/17 1429  BP: (!) 133/93  Pulse: (!) 120  Resp: (!) 22  Temp: (!) 97 F (36.1 C)  TempSrc: Tympanic  SpO2: 94%   Physical Exam  Constitutional: She is oriented to person, place, and time. She appears well-developed and well-nourished.  Baseline left-sided hemiplegia.  Braces in place on left lower extremity.  Accompanied.  No acute distress.  HENT:  Head: Atraumatic.  Nose: Nose normal.  Mouth/Throat: Oropharynx is clear and moist. No oropharyngeal exudate.  Eyes: Conjunctivae are normal. No scleral icterus.  Neck: Neck supple.  Cardiovascular: Regular rhythm and normal heart sounds. Tachycardia present.  Pulmonary/Chest: Effort  normal and breath sounds normal.  Abdominal: Soft. She exhibits no distension.  Ileostomy in place  Musculoskeletal: She exhibits no edema.  Unable to stand and bear weight due to pain.  In wheelchair.  Neurological: She is alert and oriented to person, place, and time.  Abnormal word finding  Skin: Skin is warm and dry.  Psychiatric: She has a normal mood and affect.     CMP Latest Ref Rng & Units 11/02/2017  Glucose 70 - 99 mg/dL 135(H)  BUN 8 - 23 mg/dL 29(H)  Creatinine 0.44 - 1.00 mg/dL 1.19(H)  Sodium 135 - 145 mmol/L 135  Potassium 3.5 - 5.1 mmol/L 3.9  Chloride 98 - 111 mmol/L 102  CO2 22 - 32 mmol/L 20(L)  Calcium 8.9 - 10.3 mg/dL 9.9  Total Protein 6.5 - 8.1 g/dL 7.4  Total Bilirubin 0.3 - 1.2 mg/dL 1.2  Alkaline Phos 38 - 126 U/L 346(H)  AST 15 - 41 U/L 68(H)  ALT 0 - 44 U/L 34   CBC Latest Ref Rng & Units 11/02/2017  WBC 3.6 - 11.0 K/uL 13.2(H)  Hemoglobin 12.0 - 16.0 g/dL 10.9(L)  Hematocrit 35.0 - 47.0 % 32.2(L)  Platelets 150 - 440 K/uL 353    No images are attached to the encounter.  No results found.  Assessment and plan- Patient is a 62 y.o. female diagnosed with metastatic colon cancer who presents to symptom management clinic for pain.   1.  History of stage III colon cancer- s/p 5 cycles of adjuvant FOLFOX; discontinued due to complications.  Surveillance imaging revealed large sacral mass concerning for metastasis.  Dr. Janese Banks was able to obtain pathology slides which revealed invasive adenocarcinoma with greater than 50% signet cell histology.  Current plan for palliative FOLFIRI due to history of CVA and complications associated with prior FOLFOX.  Patient understands that chemotherapy given with palliative intent.   2.  Sacral metastasis-causing nerve root impingement, currently receiving palliative radiation.  Again reinforced that he can take a couple of weeks for radiation to improve pain but sometimes radiation may not help the pain and in those  instances narcotic pain medications can be useful (see below).   3.  Cancer associated pain-bone scan today reveals multiple sites of abnormal osseous tracer accumulation consistent with metastatic disease including calvarium, proximal and mid right humerus, distal sternum, mid thoracic and upper lumbar spine, posterior right rib, multiple sites and pelvis bilaterally, bilateral proximal femora greater on right.  Findings correlate with sites of pain particularly right femur.  Patient concerned of somnolence with pain medication.  Was previously taking oxycodone 5 mg which she felt was ineffective.  Currently on morphine 15 mg tablet q6h prn which she feels may be "too strong".  Discussed taking 1/2 tablet of morphine (7.5 mg) orally for pain to assess response and tolerability.  When she is more stable he controlled on pain medication we could consider long-acting pain medication.  Could also consider bisphosphonate in setting of osseous metastases.  Will defer to Dr. Janese Banks.  4.  Opioid-induced constipation-patient not yet reporting constipation but has noticed reduced output through her ileostomy.  In light of pain medications, continue to monitor.  5. Elevated creatinine- serum creatinine today increased from baseline 1.19 today. Bone scan today reveals renal cortical retention bilaterally suggestive of renal dysfunction.  Continue to monitor.  6.  Goals of care-I introduced palliative care as specialized medical care for people living with serious illness. It focuses on providing relief from the symptoms and stress of a serious illness. The goal is to improve quality of life for both the patient and the family. Due to her pain she is having an increasingly difficult time coming to appointments and feel that she may benefit from home palliative care in the near future as well as discussions of code status, advanced directives, and home health.  Patient is interested in referral but request to defer at this  time.  Could also consider referral to outpatient palliative care clinic at [facility], Billey Chang, NP.    Follow-up with Dr. Janese Banks as scheduled on 11/10/2017. Patient advised to notify the clinic if there is no improvement in symptoms or if symptoms worsen in next 3-4 days.   Visit Diagnosis 1. Malignant neoplasm of colon, unspecified part of colon ([place])   2. Bone metastases ([place])   3. Neoplasm related pain   4. Elevated serum creatinine   5. Goals of care, counseling/discussion     Patient expressed understanding and was in agreement with this plan. She also understands that She can call clinic at any time with any questions, concerns, or complaints.   Thank you for allowing me to participate in the care of this very pleasant patient.   Beckey Rutter, DNP, AGNP-C [place] at [facility] [phone] (work cell) [phone] (office)   CC: Dr. Janese Banks

## 2017-11-03 ENCOUNTER — Ambulatory Visit: Payer: BLUE CROSS/BLUE SHIELD

## 2017-11-03 ENCOUNTER — Telehealth: Payer: Self-pay | Admitting: Surgery

## 2017-11-03 ENCOUNTER — Ambulatory Visit: Admission: RE | Admit: 2017-11-03 | Payer: BLUE CROSS/BLUE SHIELD | Source: Ambulatory Visit | Admitting: Surgery

## 2017-11-03 ENCOUNTER — Ambulatory Visit: Payer: BLUE CROSS/BLUE SHIELD | Admitting: Physical Therapy

## 2017-11-03 ENCOUNTER — Encounter: Admission: RE | Payer: Self-pay | Source: Ambulatory Visit

## 2017-11-03 SURGERY — INSERTION, TUNNELED CENTRAL VENOUS DEVICE, WITH PORT
Anesthesia: Choice

## 2017-11-03 MED ORDER — CEFAZOLIN SODIUM-DEXTROSE 2-4 GM/100ML-% IV SOLN: 2.0000 g | INTRAVENOUS | Status: DC

## 2017-11-03 NOTE — Telephone Encounter (Signed)
I have spoken with the patient's husband. He states that Theresa Brock was in a tremendous amount of pain today and could not get her out of the bed. He feels that this pain started after her imaging yesterday-Nuclear Medicine Whole Body Bone Scan.   He stated that they would like to talk with Dr. Janese Banks before rescheduling the port placement.   I did tell the patient's husband to please call our office with any questions and once a decision has been made for port placement.

## 2017-11-04 ENCOUNTER — Ambulatory Visit: Admission: RE | Admit: 2017-11-04 | Payer: BLUE CROSS/BLUE SHIELD | Source: Ambulatory Visit

## 2017-11-04 ENCOUNTER — Ambulatory Visit: Payer: BLUE CROSS/BLUE SHIELD

## 2017-11-04 ENCOUNTER — Ambulatory Visit
Admission: RE | Admit: 2017-11-04 | Discharge: 2017-11-04 | Disposition: A | Discharge status: Home / Self Care | Payer: BLUE CROSS/BLUE SHIELD | Source: Ambulatory Visit | Attending: Radiation Oncology | Admitting: Radiation Oncology

## 2017-11-04 DIAGNOSIS — Z510 Encounter for antineoplastic radiation therapy: Secondary | ICD-10-CM | POA: Insufficient documentation

## 2017-11-04 DIAGNOSIS — C7951 Secondary malignant neoplasm of bone: Secondary | ICD-10-CM | POA: Insufficient documentation

## 2017-11-04 DIAGNOSIS — C189 Malignant neoplasm of colon, unspecified: Secondary | ICD-10-CM | POA: Insufficient documentation

## 2017-11-05 ENCOUNTER — Encounter: Payer: Self-pay | Admitting: Oncology

## 2017-11-05 ENCOUNTER — Ambulatory Visit: Payer: BLUE CROSS/BLUE SHIELD

## 2017-11-05 ENCOUNTER — Inpatient Hospital Stay: Payer: BLUE CROSS/BLUE SHIELD | Attending: Oncology | Admitting: Oncology

## 2017-11-05 ENCOUNTER — Inpatient Hospital Stay: Payer: BLUE CROSS/BLUE SHIELD

## 2017-11-05 ENCOUNTER — Telehealth: Payer: Self-pay | Admitting: *Deleted

## 2017-11-05 ENCOUNTER — Ambulatory Visit: Payer: BLUE CROSS/BLUE SHIELD | Admitting: Physical Therapy

## 2017-11-05 VITALS — BP 117/80 | HR 112

## 2017-11-05 VITALS — BP 91/67 | HR 116 | Temp 98.6°F | Resp 18 | Ht 60.0 in

## 2017-11-05 DIAGNOSIS — Z23 Encounter for immunization: Secondary | ICD-10-CM | POA: Insufficient documentation

## 2017-11-05 DIAGNOSIS — R531 Weakness: Secondary | ICD-10-CM

## 2017-11-05 DIAGNOSIS — Z933 Colostomy status: Secondary | ICD-10-CM | POA: Diagnosis not present

## 2017-11-05 DIAGNOSIS — R262 Difficulty in walking, not elsewhere classified: Secondary | ICD-10-CM | POA: Diagnosis not present

## 2017-11-05 DIAGNOSIS — R52 Pain, unspecified: Secondary | ICD-10-CM

## 2017-11-05 DIAGNOSIS — T402X5A Adverse effect of other opioids, initial encounter: Secondary | ICD-10-CM | POA: Insufficient documentation

## 2017-11-05 DIAGNOSIS — Z79899 Other long term (current) drug therapy: Secondary | ICD-10-CM

## 2017-11-05 DIAGNOSIS — C189 Malignant neoplasm of colon, unspecified: Secondary | ICD-10-CM | POA: Diagnosis not present

## 2017-11-05 DIAGNOSIS — Z8673 Personal history of transient ischemic attack (TIA), and cerebral infarction without residual deficits: Secondary | ICD-10-CM | POA: Diagnosis not present

## 2017-11-05 DIAGNOSIS — R000 Tachycardia, unspecified: Secondary | ICD-10-CM

## 2017-11-05 DIAGNOSIS — N179 Acute kidney failure, unspecified: Secondary | ICD-10-CM

## 2017-11-05 DIAGNOSIS — Z923 Personal history of irradiation: Secondary | ICD-10-CM | POA: Diagnosis not present

## 2017-11-05 DIAGNOSIS — I959 Hypotension, unspecified: Secondary | ICD-10-CM | POA: Diagnosis not present

## 2017-11-05 DIAGNOSIS — E860 Dehydration: Secondary | ICD-10-CM

## 2017-11-05 DIAGNOSIS — Z9221 Personal history of antineoplastic chemotherapy: Secondary | ICD-10-CM | POA: Diagnosis not present

## 2017-11-05 DIAGNOSIS — Z7189 Other specified counseling: Secondary | ICD-10-CM

## 2017-11-05 DIAGNOSIS — C7951 Secondary malignant neoplasm of bone: Secondary | ICD-10-CM | POA: Diagnosis not present

## 2017-11-05 DIAGNOSIS — G893 Neoplasm related pain (acute) (chronic): Secondary | ICD-10-CM | POA: Diagnosis not present

## 2017-11-05 DIAGNOSIS — F5089 Other specified eating disorder: Secondary | ICD-10-CM | POA: Insufficient documentation

## 2017-11-05 DIAGNOSIS — K5903 Drug induced constipation: Secondary | ICD-10-CM | POA: Insufficient documentation

## 2017-11-05 MED ORDER — INFLUENZA VAC SPLIT QUAD 0.5 ML IM SUSY
0.5000 mL | PREFILLED_SYRINGE | Freq: Once | INTRAMUSCULAR | Status: AC
  Administered 2017-11-05: 0.5 mL via INTRAMUSCULAR
  Filled 2017-11-05: qty 0.5

## 2017-11-05 MED ORDER — SODIUM CHLORIDE 0.9 % IV SOLN
Freq: Once | INTRAVENOUS | Status: AC
  Administered 2017-11-05: 10:00:00 via INTRAVENOUS
  Filled 2017-11-05: qty 250

## 2017-11-05 MED ORDER — MORPHINE SULFATE 2 MG/ML IJ SOLN
2.0000 mg | Freq: Once | INTRAMUSCULAR | Status: AC
  Administered 2017-11-05: 2 mg via INTRAVENOUS
  Filled 2017-11-05: qty 1

## 2017-11-05 MED ORDER — FENTANYL 12 MCG/HR TD PT72: 12.5000 ug | MEDICATED_PATCH | TRANSDERMAL | 0 refills | Status: DC

## 2017-11-05 NOTE — Telephone Encounter (Signed)
New fentanyl patch

## 2017-11-06 ENCOUNTER — Ambulatory Visit: Payer: BLUE CROSS/BLUE SHIELD | Admitting: Oncology

## 2017-11-06 ENCOUNTER — Telehealth: Payer: Self-pay

## 2017-11-06 ENCOUNTER — Telehealth: Payer: Self-pay | Admitting: *Deleted

## 2017-11-06 ENCOUNTER — Ambulatory Visit
Admission: RE | Admit: 2017-11-06 | Discharge: 2017-11-06 | Disposition: A | Discharge status: Home / Self Care | Payer: BLUE CROSS/BLUE SHIELD | Source: Ambulatory Visit | Attending: Oncology | Admitting: Oncology

## 2017-11-06 DIAGNOSIS — I639 Cerebral infarction, unspecified: Secondary | ICD-10-CM | POA: Diagnosis not present

## 2017-11-06 DIAGNOSIS — R9089 Other abnormal findings on diagnostic imaging of central nervous system: Secondary | ICD-10-CM | POA: Diagnosis not present

## 2017-11-06 DIAGNOSIS — C7951 Secondary malignant neoplasm of bone: Secondary | ICD-10-CM | POA: Insufficient documentation

## 2017-11-06 DIAGNOSIS — R269 Unspecified abnormalities of gait and mobility: Secondary | ICD-10-CM | POA: Diagnosis not present

## 2017-11-06 DIAGNOSIS — C189 Malignant neoplasm of colon, unspecified: Secondary | ICD-10-CM | POA: Insufficient documentation

## 2017-11-06 DIAGNOSIS — Z7189 Other specified counseling: Secondary | ICD-10-CM | POA: Insufficient documentation

## 2017-11-06 MED ORDER — GADOBUTROL 1 MMOL/ML IV SOLN
7.0000 mL | Freq: Once | INTRAVENOUS | Status: AC | PRN
  Administered 2017-11-06: 6.5 mL via INTRAVENOUS

## 2017-11-06 MED ORDER — DEXAMETHASONE 4 MG PO TABS: 8.0000 mg | ORAL_TABLET | Freq: Every day | ORAL | 0 refills | Status: DC

## 2017-11-06 NOTE — Telephone Encounter (Signed)
PA was adminster today ( spoke with Dawn P ID number [id number]) will result in 3 day turn around time.

## 2017-11-06 NOTE — Telephone Encounter (Signed)
I called back to the husband and got the voicemail and left message that we checked this am about the approval of the fentanyl patch. It was not approved yet and when we called we were told that we can't get approval on cover my meds when pt lives in [place] but insurance is [place] in [place]. Courtney spent 25 min on the phone and finally got to the correct place and she gave info over the phone and said it will be up to 3 days to get response. Since it will be next week , Dr. Janese Banks wanted pt to start steroids to see if that would help. I will send the rx to the pharmacy, I asked husband to call me back and let me know that he got message.

## 2017-11-06 NOTE — Progress Notes (Signed)
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   She had presented with abdominal pain nausea vomiting and intermittent rectal bleeding in April 2018 and was found to have small bowel obstruction at that time.  Patient had a total colectomy on 06/04/2016 pathology showed invasive adenocarcinoma ring-type involving the cecum and the ascending colon. Tubulovillous adenoma with high-grade dysplasia in the ascending colon. 2 small tubular adenomas in the ascending colon. One inflammatory polyp in the ascending colon. Serrated adenoma in the transverse colon. 8 out of 19 pericolonic lymph nodes were positive for tumor. No loss of nuclear expression of MMR proteins. Tumor was poorly differentiated and invades through the muscularis propria into pericolonic tissue. No microscopy perforation. L VIN PNI were present. Margins were negative. pT3pN2b  Patient had a complicated postoperative course following the colectomy with postoperative ileus C. difficile colitis and was admitted in the ICU for septic shock with pressors. There was a considerable delay because of that in starting adjuvant chemotherapy. CBC back in July  2018 was normal with a white count of 10.7, H&H of 12/37 and a platelet count of 445. LFTs were within normal limits and serum creatinine was 0.6. CT chest abdomen and pelvis in July 2018 did not reveal any evidence of recurrent or metastatic disease.  Patient was also seen by GYN for thickened endometrium and underwent transvaginal ultrasound in August 2018 which showed a 2.8 cm fibroid as well as endometrial thickening of 12.8 mm. Plan was for hysteroscopy and D&C in September 2018  Adjuvant chemotherapy with FOLFOX was planned every 2 weeks for 12 cycles which started in August 2018. Adjuvant chemotherapy was initiated on 09/30/2016 and from review of records so far I see that her last chemotherapy was cycle #5 that was given on 11/27/2016 after that chemo patient was admitted to the hospital on 12/07/2016 with renal failure and failure to thrive. Patient did not desire further chemo at that point  Patient has Gerber from [place] to be with her daughter and son-in-law but is not currently sure how long she will stay here. She has lived in an [place] for the last 20 years.   Routine surveillance scans done for colon cancer on 10/02/2017 revealed large destructive lesion in the left sacrum consistent with metastatic colon cancer.outside colon cancer resection slides were obtained for our path review and was consistent with signet ring cell carcinoma. MSI stable. No evidence of KRAS, NRAS or BRAF mutation  Patient was sent from IV to radiation to the cecal mass but continued to complain of worsening low back pain as well as pain in the right leg while ambulating.  This was followed by a bone scan which showed multiple bone lesions including the calvarium proximal and mid right humerus, distal sternum, mid thoracic and upper lumbar  spine, posterior right rib, multiple sites in the pelvis as well as bilateral proximal femoral greater on the right.  Interval history-patient is here with her daughter  and husband today.  She is continuing to decline and has not had any significant pain relief despite getting palliative radiation to her sacrum.  Previously she was able to at least get up and out of the wheelchair and ambulate but currently she is unable to ambulate and is totally dependent on her husband for her basic ADLs.  She has met with radiation oncology and plans to get single fraction palliative radiation to her bilateral hips in 3 days.  She started taking short-acting morphine but states that it makes her sleepy and they were concerned that her blood pressure was going down because of morphine.  She has also been keeping a tab of her blood pressure at home and they have mostly been running in the 80s.  Her appetite is poor and she has barely been able to eat mainly because she is not able to sit upright eat because of pain in her tailbone.  Patient was unable to get out of the house and would therefore not go for port placement  ECOG PS- 3 Pain scale- 8 Opioid associated constipation- no  Review of systems- Review of Systems  Constitutional: Positive for malaise/fatigue and weight loss. Negative for chills and fever.       Lack of appetite  HENT: Negative for congestion, ear discharge and nosebleeds.   Eyes: Negative for blurred vision.  Respiratory: Negative for cough, hemoptysis, sputum production, shortness of breath and wheezing.   Cardiovascular: Negative for chest pain, palpitations, orthopnea and claudication.  Gastrointestinal: Negative for abdominal pain, blood in stool, constipation, diarrhea, heartburn, melena, nausea and vomiting.  Genitourinary: Negative for dysuria, flank pain, frequency, hematuria and urgency.  Musculoskeletal: Negative for back pain, joint pain and myalgias.       Tailbone pain and right leg pain  Skin: Negative for rash.  Neurological: Negative for dizziness, tingling, focal weakness, seizures, weakness and headaches.  Endo/Heme/Allergies: Does not  bruise/bleed easily.  Psychiatric/Behavioral: Negative for depression and suicidal ideas. The patient does not have insomnia.       No Known Allergies   Past Medical History:  Diagnosis Date  . Colon cancer ([place])   . Diplopia 07/05/2014   Overview:  Converted from [facility]: Description - DIPLOPIA  . Dysphagia, oropharyngeal phase 09/20/2013   Overview:  Converted from Centricity: Description - DYSPHAGIA, OROPHARYNGEAL PHASE  . Essential (primary) hypertension 09/20/2013   Overview:  Converted from Centricity: Description - HYPERTENSION, BENIGN ESSENTIAL  . GERD (gastroesophageal reflux disease)   . Hyperlipidemia   . Hypertension   . Malignant neoplasm of colon ([place]) 01/23/2017  . Stroke ([place])   . Tinea unguium 09/20/2013   Overview:  Converted from Centricity: Description - ONYCHOMYCOSIS, TOENAILS     Past Surgical History:  Procedure Laterality Date  . COLOSTOMY    . FLEXIBLE SIGMOIDOSCOPY N/A 05/14/2017   Procedure: FLEXIBLE SIGMOIDOSCOPY;  Surgeon: Lin Landsman, MD;  Location: [facility] ENDOSCOPY;  Service: Gastroenterology;  Laterality: N/A;  . PORT A CATH INJECTION ([place] [facility]) Right    7/18    Social History   Socioeconomic History  . Marital status: Married    Spouse name: Not on file  . Number of children: Not on file  . Years of education: Not on file  . Highest education level: Not on file  Occupational History  . Not on file  Social Needs  . Financial resource strain: Not on file  . Food insecurity:    Worry: Not on file    Inability: Not on file  . Transportation needs:    Medical: Not on file    Non-medical: Not on file  Tobacco Use  . Smoking status: Never Smoker  . Smokeless tobacco: Never Used  Substance and Sexual Activity  . Alcohol use: No  . Drug use: No  . Sexual activity: Not on file  Lifestyle  . Physical activity:    Days per week: Not on file    Minutes per session: Not on file  . Stress: Not on file  Relationships  . Social  connections:    Talks on phone: Not on file    Gets together: Not on file    Attends religious service: Not on file    Active member of club or organization: Not on file    Attends meetings of clubs or organizations: Not on file    Relationship status: Not on file  . Intimate partner violence:    Fear of current or ex partner: Not on file    Emotionally abused: Not on file    Physically abused: Not on file    Forced sexual activity: Not on file  Other Topics Concern  . Not on file  Social History Narrative  . Not on file    Family History  Problem Relation Age of Onset  . Throat cancer Brother   . Stroke Mother   . Lung cancer Father      Current Outpatient Medications:  .  atorvastatin (LIPITOR) 20 MG tablet, Take 1 tablet (20 mg total) by mouth daily at 6 PM., Disp: 90 tablet, Rfl: 1 .  losartan (COZAAR) 25 MG tablet, Take 1 tablet (25 mg total) by mouth daily., Disp: 90 tablet, Rfl: 1 .  morphine (MSIR) 15 MG tablet, Take 1 tablet (15 mg total) by mouth every 6 (six) hours as needed for up to 14 days for severe pain., Disp: 56 tablet, Rfl: 0 .  Multiple Vitamin (MULTIVITAMIN WITH MINERALS) TABS tablet, Take 1 tablet by mouth daily., Disp: , Rfl:  .  acetaminophen (TYLENOL) 325 MG tablet, Take 650 mg by mouth every 6 (six) hours as needed., Disp: , Rfl:  .  dexamethasone (DECADRON) 4 MG tablet, Take 2 tablets (8 mg total) by mouth daily. Start the day after chemo for 2 days. (Patient not taking: Reported on 10/28/2017), Disp: 8 tablet, Rfl: 5 .  fentaNYL (DURAGESIC - DOSED MCG/HR) 12 MCG/HR, Place 1 patch (12.5 mcg total) onto the skin every 3 (three) days., Disp: 10 patch, Rfl: 0 .  loperamide (IMODIUM A-D) 2 MG tablet, Take 1 tablet (2 mg total) by mouth 3 (three) times daily as needed. Take 2 at onset , then 1 every 2hr with each BM. Max dose 16 mg (Patient not taking: Reported on 10/28/2017), Disp: 100 tablet, Rfl: 1 .  LORazepam (ATIVAN) 1 MG tablet, Take 1 tablet (1 mg total)  by mouth every 6 (six) hours as needed (NAUSEA). (Patient not taking: Reported on 10/28/2017), Disp: 30 tablet, Rfl: 0 .  ondansetron (ZOFRAN) 8 MG tablet, Take 1 tablet (8 mg total) by mouth 2 (two) times daily as needed for refractory nausea / vomiting. Start on day 3 after chemotherapy. (Patient not taking: Reported on 10/28/2017), Disp: 30 tablet, Rfl: 1 .  prochlorperazine (COMPAZINE) 10 MG tablet, Take 1 tablet (10 mg total) by mouth every 6 (six)  hours as needed (NAUSEA). (Patient not taking: Reported on 10/28/2017), Disp: 30 tablet, Rfl: 1  Physical exam:  Vitals:   11/05/17 0906  BP: 91/67  Pulse: (!) 116  Resp: 18  Temp: 98.6 F (37 C)  TempSrc: Tympanic  Height: 5' (1.524 m)   Physical Exam  Constitutional: She is oriented to person, place, and time.  Patient is thin and fatigued and sitting in a wheelchair.  HENT:  Head: Normocephalic and atraumatic.  Eyes: Pupils are equal, round, and reactive to light. EOM are normal.  Neck: Normal range of motion.  Cardiovascular: Regular rhythm and normal heart sounds.  Tachycardic  Pulmonary/Chest: Effort normal and breath sounds normal.  Abdominal: Soft. Bowel sounds are normal.  Colostomy in place  Musculoskeletal:  She has baseline left sided hemiplegia from prior stroke  Neurological: She is alert and oriented to person, place, and time.  Skin: Skin is warm and dry.     CMP Latest Ref Rng & Units 11/02/2017  Glucose 70 - 99 mg/dL 135(H)  BUN 8 - 23 mg/dL 29(H)  Creatinine 0.44 - 1.00 mg/dL 1.19(H)  Sodium 135 - 145 mmol/L 135  Potassium 3.5 - 5.1 mmol/L 3.9  Chloride 98 - 111 mmol/L 102  CO2 22 - 32 mmol/L 20(L)  Calcium 8.9 - 10.3 mg/dL 9.9  Total Protein 6.5 - 8.1 g/dL 7.4  Total Bilirubin 0.3 - 1.2 mg/dL 1.2  Alkaline Phos 38 - 126 U/L 346(H)  AST 15 - 41 U/L 68(H)  ALT 0 - 44 U/L 34   CBC Latest Ref Rng & Units 11/02/2017  WBC 3.6 - 11.0 K/uL 13.2(H)  Hemoglobin 12.0 - 16.0 g/dL 10.9(L)  Hematocrit 35.0 - 47.0 %  32.2(L)  Platelets 150 - 440 K/uL 353    No images are attached to the encounter.  Nm Bone Scan Whole Body  Result Date: 11/02/2017 CLINICAL DATA:  Colon cancer, bone metastases EXAM: NUCLEAR MEDICINE WHOLE BODY BONE SCAN TECHNIQUE: Whole body anterior and posterior images were obtained approximately 3 hours after intravenous injection of radiopharmaceutical. RADIOPHARMACEUTICALS:  23.273 mCi Technetium-40mMDP IV COMPARISON:  None Correlation: CT chest abdomen pelvis 10/02/2017 FINDINGS: Multiple sites of abnormal osseous tracer accumulation are identified consistent with osseous metastatic disease. These include calvarium, proximal and mid RIGHT humerus, distal sternum, midthoracic and upper lumbar spine, posterior RIGHT rib, multiple sites in pelvis bilaterally, and BILATERAL proximal femora greater on RIGHT. Somewhat pronounced renal cortical retention of tracer bilaterally suggesting renal dysfunction. Expected soft tissue distribution of tracer. IMPRESSION: Multiple osseous metastases as above, including BILATERAL femora and RIGHT humerus as discussed above. Electronically Signed   By: MLavonia DanaM.D.   On: 11/02/2017 17:28     Assessment and plan- Patient is a 62y.o. female with history of stage III colon cancer in 2018 status post adjuvant chemotherapy which she did not complete.  She now has evidence of diffuse bone metastases  At baseline patient had difficulty ambulating mainly due to her prior stroke.  However a couple of months before she was able to get out of the wheelchair and ambulates slowly to use the restroom and carry out her ADLs.  However this has significantly worsened over the last couple of weeks and despite getting palliative radiation to her sacrum she has not had much pain relief.  I discussed the results of the bone scan which shows diffuse bone metastases mainly in her bilateral femora which is likely causing pain during ambulation.  I have recommended that patient  should proceed with palliative radiation on Monday as planned.  We will continue to work with her pain medicine if we can get her pain under better control to be able to pursue systemic treatment.  However if patient's performance status does not improve despite pain control, it would be appropriate to consider hospice.  Patient will continue to take PRN morphine 15 mg-half to 1 tablet every 4 as needed and we will obtain insurance authorization to start fentanyl patch 12 mcg.  We will reassess her pain on 11/09/2017 in our symptom management clinic when she comes get her palliative radiation to her hip she continues to have uncontrolled pain we will further titrate fentanyl patch and morphine accordingly.  We could also consider short course of steroids for acute control of pain 2 days time.  Discussed the role for bisphosphonates and bone metastases.  Discussed the risk of possible osteonecrosis of the jaw for dental clearance which would be difficult for the patient given her performance status.  Patient's family would like to think about risks versus benefits of proceeding with zometa and if they are willing to accept the risk we will give her zometa on 11/09/2017.  Patient also has an MRI brain to rule out metastatic disease given her ongoing weakness.  Patient is hypotensive and tachycardic today I have asked her to hold her antihypertensive losartan medication at this time.  We will give her 1 L of IV fluids and she will potentially need more fluids on 11/09/2017 as well.   Total face to face encounter time for this patient visit was 45 min. >50% of the time was  spent in counseling and coordination of care.   Visit Diagnosis 1. Malignant neoplasm of colon, unspecified part of colon ([place])   2. Goals of care, counseling/discussion   3. Neoplasm related pain   4. Acute renal failure, unspecified acute renal failure type [facility])      Dr. Randa Evens, MD, MPH [facility] at [facility] [phone] 11/06/2017 12:44 PM

## 2017-11-09 ENCOUNTER — Inpatient Hospital Stay: Payer: BLUE CROSS/BLUE SHIELD

## 2017-11-09 ENCOUNTER — Telehealth: Payer: Self-pay | Admitting: *Deleted

## 2017-11-09 ENCOUNTER — Ambulatory Visit: Payer: BLUE CROSS/BLUE SHIELD

## 2017-11-09 ENCOUNTER — Inpatient Hospital Stay (HOSPITAL_BASED_OUTPATIENT_CLINIC_OR_DEPARTMENT_OTHER): Payer: BLUE CROSS/BLUE SHIELD | Admitting: Nurse Practitioner

## 2017-11-09 ENCOUNTER — Encounter: Payer: Self-pay | Admitting: Nurse Practitioner

## 2017-11-09 ENCOUNTER — Other Ambulatory Visit: Payer: Self-pay

## 2017-11-09 VITALS — BP 112/78 | HR 128 | Temp 97.2°F | Resp 22

## 2017-11-09 DIAGNOSIS — C189 Malignant neoplasm of colon, unspecified: Secondary | ICD-10-CM

## 2017-11-09 DIAGNOSIS — I69398 Other sequelae of cerebral infarction: Secondary | ICD-10-CM

## 2017-11-09 DIAGNOSIS — C7951 Secondary malignant neoplasm of bone: Secondary | ICD-10-CM

## 2017-11-09 DIAGNOSIS — Z9221 Personal history of antineoplastic chemotherapy: Secondary | ICD-10-CM

## 2017-11-09 DIAGNOSIS — Z8673 Personal history of transient ischemic attack (TIA), and cerebral infarction without residual deficits: Secondary | ICD-10-CM

## 2017-11-09 DIAGNOSIS — K5903 Drug induced constipation: Secondary | ICD-10-CM

## 2017-11-09 DIAGNOSIS — R531 Weakness: Secondary | ICD-10-CM | POA: Diagnosis not present

## 2017-11-09 DIAGNOSIS — Z7189 Other specified counseling: Secondary | ICD-10-CM

## 2017-11-09 DIAGNOSIS — Z933 Colostomy status: Secondary | ICD-10-CM

## 2017-11-09 DIAGNOSIS — G893 Neoplasm related pain (acute) (chronic): Secondary | ICD-10-CM | POA: Diagnosis not present

## 2017-11-09 DIAGNOSIS — Z923 Personal history of irradiation: Secondary | ICD-10-CM

## 2017-11-09 DIAGNOSIS — R269 Unspecified abnormalities of gait and mobility: Secondary | ICD-10-CM

## 2017-11-09 DIAGNOSIS — Z79899 Other long term (current) drug therapy: Secondary | ICD-10-CM

## 2017-11-09 DIAGNOSIS — R262 Difficulty in walking, not elsewhere classified: Secondary | ICD-10-CM

## 2017-11-09 LAB — BASIC METABOLIC PANEL
Anion gap: 15 (ref 5–15)
BUN: 11 mg/dL (ref 8–23)
CO2: 23 mmol/L (ref 22–32)
Calcium: 10.4 mg/dL — ABNORMAL HIGH (ref 8.9–10.3)
Chloride: 96 mmol/L — ABNORMAL LOW (ref 98–111)
Creatinine, Ser: 0.88 mg/dL (ref 0.44–1.00)
GFR calc non Af Amer: >60 mL/min (ref >60)
Glucose, Bld: 139 mg/dL — ABNORMAL HIGH (ref 70–99)
POTASSIUM: 3.9 mmol/L (ref 3.5–5.1)
Sodium: 134 mmol/L — ABNORMAL LOW (ref 135–145)

## 2017-11-09 MED ORDER — MORPHINE SULFATE (PF) 2 MG/ML IV SOLN
2.0000 mg | Freq: Once | INTRAVENOUS | Status: AC
  Administered 2017-11-09: 2 mg via INTRAVENOUS
  Filled 2017-11-09: qty 1

## 2017-11-09 MED ORDER — SODIUM CHLORIDE 0.9 % IV SOLN
Freq: Once | INTRAVENOUS | Status: AC
  Administered 2017-11-09: 15:00:00 via INTRAVENOUS
  Filled 2017-11-09: qty 250

## 2017-11-09 MED ORDER — ZOLEDRONIC ACID 4 MG/100ML IV SOLN
4.0000 mg | INTRAVENOUS | Status: DC
  Administered 2017-11-09: 4 mg via INTRAVENOUS
  Filled 2017-11-09: qty 100

## 2017-11-09 MED ORDER — MORPHINE SULFATE ER 15 MG PO TBCR: 15.0000 mg | EXTENDED_RELEASE_TABLET | Freq: Two times a day (BID) | ORAL | 0 refills | Status: DC

## 2017-11-09 MED ORDER — MORPHINE SULFATE 2 MG/ML IJ SOLN
4.0000 mg | Freq: Once | INTRAMUSCULAR | Status: AC
  Administered 2017-11-09: 4 mg via INTRAVENOUS
  Filled 2017-11-09: qty 2

## 2017-11-09 NOTE — Progress Notes (Signed)
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  At baseline she ambulated less than 30 feet with cane and used wheelchair for long distances.  She was diagnosed with stage IIIc adenocarcinoma of the colon in May 2018 in [place], [place].  She had presented with abdominal pain nausea vomiting and intermittent rectal bleeding in April 2018 and was found to have small bowel obstruction at that time.  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  No loss of nuclear expression of MMR proteins. Tumor was poorly differentiated and invades through the muscularis propria into pericolonic tissue. No microscopy perforation. L VIN PNI were present. Margins were negative. pT3pN2b  Patient had a complicated postoperative course following the colectomy with postoperative ileus C. difficile colitis and was admitted in the ICU for septic shock with pressors. There was a considerable delay because of that in starting adjuvant chemotherapy. CBC back in July 2018  was normal with a white count of 10.7, H&H of 12/37 and a platelet count of 445. LFTs were within normal limits and serum creatinine was 0.6. CT chest abdomen and pelvis in July 2018 did not reveal any evidence of recurrent or metastatic disease.  Patient was also seen by GYN for thickened endometrium and underwent transvaginal ultrasound in August 2018 which showed a 2.8 cm fibroid as well as endometrial thickening of 12.8 mm. Plan was for hysteroscopy and D&C in September 2018  Adjuvant chemotherapy with FOLFOX was planned every 2 weeks for 12 cycles which started in August 2018. Adjuvant chemotherapy was initiated on 09/30/2016 and, per Dr. Elroy Channel review of records, her last chemotherapy was cycle #5 that was given on 11/27/2016. Patient was admitted to the hospital on 12/07/2016 with renal failure and failure to thrive and did not desire further chemotherapy after that point.  Patient lives in an [place] and travels between [place] and [place].  Per patient, she returned to baseline after stopping chemotherapy in October 2018 and has been on surveillance since that time.  10/02/2017-CT imaging revealed new large 5.3 cm destructive lesion in the left sacrum consistent with metastatic colon cancer.  Lesion encroaches on the left S1 nerve root.  No other evidence of metastatic disease identified in the chest, abdomen, or pelvis.  She was experiencing pain and was evaluated by radiation oncology for palliative radiation.     Malignant neoplasm of colon ([place])   01/23/2017 Initial Diagnosis    Malignant neoplasm of colon ([place])    10/25/2017 -  Chemotherapy    The patient had palonosetron (ALOXI) injection 0.25 mg, 0.25 mg, Intravenous,  Once, 0 of 4 cycles irinotecan (CAMPTOSAR) 300 mg in dextrose 5 % 500 mL chemo  infusion, 180 mg/m2, Intravenous,  Once, 0 of 4 cycles leucovorin 672 mg in dextrose 5 % 250 mL infusion, 400 mg/m2, Intravenous,  Once, 0 of 4 cycles fluorouracil (ADRUCIL) chemo  injection 650 mg, 400 mg/m2, Intravenous,  Once, 0 of 4 cycles fluorouracil (ADRUCIL) 4,050 mg in sodium chloride 0.9 % 69 mL chemo infusion, 2,400 mg/m2, Intravenous, 1 Day/Dose, 0 of 4 cycles  for chemotherapy treatment.      Bone metastases ([place])   10/25/2017 -  Chemotherapy    The patient had palonosetron (ALOXI) injection 0.25 mg, 0.25 mg, Intravenous,  Once, 0 of 4 cycles irinotecan (CAMPTOSAR) 300 mg in dextrose 5 % 500 mL chemo infusion, 180 mg/m2, Intravenous,  Once, 0 of 4 cycles leucovorin 672 mg in dextrose 5 % 250 mL infusion, 400 mg/m2, Intravenous,  Once, 0 of 4 cycles fluorouracil (ADRUCIL) chemo injection 650 mg, 400 mg/m2, Intravenous,  Once, 0 of 4 cycles fluorouracil (ADRUCIL) 4,050 mg in sodium chloride 0.9 % 69 mL chemo infusion, 2,400 mg/m2, Intravenous, 1 Day/Dose, 0 of 4 cycles  for chemotherapy treatment.     10/26/2017 Initial Diagnosis    Bone metastases ([place])     Interval history- Mirka Barbone, 62 year old female with above history of colon cancer, [facility] for discussion of MRI results and evaluation of pain associated with neoplasm.   10/08/2017 evaluated by Dr. Baruch Gouty for consideration of radiation to sacrum given tumor encroachment on left S1 nerve root. 10/20/2017-initiated palliative radiation to sacrum 10/28/2017-evaluated by Dr. Dahlia Byes for port placement, scheduled for 11/03/2017-canceled due to acute pain 11/02/2017-bone scan showed multiple bony lesions including: calvarium proximal and mid right humerus, distal sternum, mid thoracic and upper lumbar spine, posterior right rib, multiple sites in the pelvis as well as bilateral proximal femoral, greater on right.   Patient was last seen by Dr. Janese Banks on 11/05/2017.  She continued to complain of low back pain and right leg pain despite palliative radiation to sacrum.  Previously she was able to stand from wheelchair and ambulate a few feet, now she is dependent for basic ADLs and is  unable to stand due to pain.  She has met with radiation oncology and plan is to receive palliative radiation to bilateral hips (in addition to sacrum). Dr. Janese Banks discussed continued efforts to improve pain control to be able to consider port placement and systemic treatment. Patient is using morphine 21m tablet (1/2 to 1 tablet) every 4 hours- tolerating better. Has not been able to get fentanyl patches. Did not start decadron. Discussed Zometa but opted to 'think on it' as patient would not be able to tolerate dental evaluation. Blood pressure was low and heart rate elevated. She was given IV fluids and asked to hold losartan.   Today, husband reports blood pressures have been >>401systolic and he continued to administer losartan. She is tolerating morphine 15 mg better but continues to have significant pain and requires frequent repositioning. Patient rates pain 9/10. Denies opioid associated constipation. Family and patient do wish to proceed with Zometa today. Husband and daughter express concern over their ability to care for patient given her declining performance status and pain. They also cite issues with space d/t living in RV. She has a wheelchair but they have been unable to use other DME d/t space limitations. Patient states that she does not feel well enough to start chemotherapy or have port placed, primarily due to pain.   MRI of brain performed on 11/06/2017 d/t  increasing weakness was negative for intracranial metastasis.    ECOG FS:3 - Symptomatic, >50% confined to bed  Review of systems- Review of Systems  Constitutional: Positive for malaise/fatigue and weight loss (poor appetite). Negative for chills and fever.  HENT: Negative for congestion, ear discharge, ear pain, sinus pain, sore throat and tinnitus.   Eyes: Negative.   Respiratory: Negative.  Negative for cough, sputum production and shortness of breath.   Cardiovascular: Negative for chest pain, palpitations, orthopnea,  claudication and leg swelling.  Gastrointestinal: Negative for abdominal pain, blood in stool, constipation, diarrhea, heartburn, nausea and vomiting.  Genitourinary: Negative.   Musculoskeletal: Positive for back pain, joint pain, myalgias and neck pain. Negative for falls.  Skin: Negative.   Neurological: Positive for weakness and headaches. Negative for dizziness and tingling.  Endo/Heme/Allergies: Negative.   Psychiatric/Behavioral: Positive for depression. The patient is nervous/anxious.     Current treatment- not on current treatment; will need port prior to chemotherapy; currently receiving palliative radiation. zometa today  No Known Allergies  Past Medical History:  Diagnosis Date  . Colon cancer ([place])   . Diplopia 07/05/2014   Overview:  Converted from [facility]: Description - DIPLOPIA  . Dysphagia, oropharyngeal phase 09/20/2013   Overview:  Converted from [facility]: Description - DYSPHAGIA, OROPHARYNGEAL PHASE  . Essential (primary) hypertension 09/20/2013   Overview:  Converted from [facility]: Description - HYPERTENSION, BENIGN ESSENTIAL  . GERD (gastroesophageal reflux disease)   . Hyperlipidemia   . Hypertension   . Malignant neoplasm of colon ([place]) 01/23/2017  . Stroke ([place])   . Tinea unguium 09/20/2013   Overview:  Converted from [facility]: Description - ONYCHOMYCOSIS, TOENAILS    Past Surgical History:  Procedure Laterality Date  . COLOSTOMY    . FLEXIBLE SIGMOIDOSCOPY N/A 05/14/2017   Procedure: FLEXIBLE SIGMOIDOSCOPY;  Surgeon: Lin Landsman, MD;  [facility] ENDOSCOPY;  Service: Gastroenterology;  Laterality: N/A;  . PORT A CATH INJECTION ([place] [facility]) Right    7/18    Social History   Socioeconomic History  . Marital status: Married    Spouse name: Not on file  . Number of children: Not on file  . Years of education: Not on file  . Highest education level: Not on file  Occupational History  . Not on file  Social Needs  . Financial resource  strain: Not on file  . Food insecurity:    Worry: Not on file    Inability: Not on file  . Transportation needs:    Medical: Not on file    Non-medical: Not on file  Tobacco Use  . Smoking status: Never Smoker  . Smokeless tobacco: Never Used  Substance and Sexual Activity  . Alcohol use: No  . Drug use: No  . Sexual activity: Not on file  Lifestyle  . Physical activity:    Days per week: Not on file    Minutes per session: Not on file  . Stress: Not on file  Relationships  . Social connections:    Talks on phone: Not on file    Gets together: Not on file    Attends religious service: Not on file    Active member of club or organization: Not on file    Attends meetings of clubs or organizations: Not on file    Relationship status: Not on file  . Intimate partner violence:    Fear of current or ex partner: Not on file    Emotionally abused: Not on file    Physically  abused: Not on file    Forced sexual activity: Not on file  Other Topics Concern  . Not on file  Social History Narrative  . Not on file    Family History  Problem Relation Age of Onset  . Throat cancer Brother   . Stroke Mother   . Lung cancer Father     Current Outpatient Medications:  .  acetaminophen (TYLENOL) 325 MG tablet, Take 650 mg by mouth every 6 (six) hours as needed., Disp: , Rfl:  .  atorvastatin (LIPITOR) 20 MG tablet, Take 1 tablet (20 mg total) by mouth daily at 6 PM., Disp: 90 tablet, Rfl: 1 .  dexamethasone (DECADRON) 4 MG tablet, Take 2 tablets (8 mg total) by mouth daily. With food, Disp: 14 tablet, Rfl: 0 .  fentaNYL (DURAGESIC - DOSED MCG/HR) 12 MCG/HR, Place 1 patch (12.5 mcg total) onto the skin every 3 (three) days., Disp: 10 patch, Rfl: 0 .  loperamide (IMODIUM A-D) 2 MG tablet, Take 1 tablet (2 mg total) by mouth 3 (three) times daily as needed. Take 2 at onset , then 1 every 2hr with each BM. Max dose 16 mg (Patient not taking: Reported on 10/28/2017), Disp: 100 tablet, Rfl:  1 .  LORazepam (ATIVAN) 1 MG tablet, Take 1 tablet (1 mg total) by mouth every 6 (six) hours as needed (NAUSEA). (Patient not taking: Reported on 10/28/2017), Disp: 30 tablet, Rfl: 0 .  losartan (COZAAR) 25 MG tablet, Take 1 tablet (25 mg total) by mouth daily., Disp: 90 tablet, Rfl: 1 .  morphine (MSIR) 15 MG tablet, Take 1 tablet (15 mg total) by mouth every 6 (six) hours as needed for up to 14 days for severe pain., Disp: 56 tablet, Rfl: 0 .  Multiple Vitamin (MULTIVITAMIN WITH MINERALS) TABS tablet, Take 1 tablet by mouth daily., Disp: , Rfl:  .  ondansetron (ZOFRAN) 8 MG tablet, Take 1 tablet (8 mg total) by mouth 2 (two) times daily as needed for refractory nausea / vomiting. Start on day 3 after chemotherapy. (Patient not taking: Reported on 10/28/2017), Disp: 30 tablet, Rfl: 1 .  prochlorperazine (COMPAZINE) 10 MG tablet, Take 1 tablet (10 mg total) by mouth every 6 (six) hours as needed (NAUSEA). (Patient not taking: Reported on 10/28/2017), Disp: 30 tablet, Rfl: 1  Physical exam:  Vitals:   11/09/17 1331  BP: 112/78  Pulse: (!) 128  Resp: (!) 22  Temp: (!) 97.2 F (36.2 C)  TempSrc: Tympanic   Physical Exam  Constitutional: She is oriented to person, place, and time.  Thin built, chronically ill appearing female; accompanied by husband and daughter; sitting in wheelchair  HENT:  Head: Atraumatic.  Nose: Nose normal.  Mouth/Throat: Oropharynx is clear and moist. No oropharyngeal exudate.  Face assymetrical at baseline; left facial droop  Eyes: Conjunctivae are normal. No scleral icterus.  Neck: Normal range of motion.  Cardiovascular: Normal heart sounds. Tachycardia present.  Pulmonary/Chest: Effort normal and breath sounds normal.  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  Baseline left hemiplegia; hx of cva. Wheelchair at baseline. Brace on left lower extremity.   Neurological: She is alert and oriented to  person, place, and time. A sensory deficit (chronic; hx of stroke) is present. She exhibits abnormal muscle tone (chronic; hx of stroke). Coordination (chronic; hx of stroke) abnormal.  Skin: Skin is warm and dry.  Psychiatric: She is slowed. Cognition and memory are impaired. She exhibits  a depressed mood.    CMP Latest Ref Rng & Units 11/09/2017  Glucose 70 - 99 mg/dL 139(H)  BUN 8 - 23 mg/dL 11  Creatinine 0.44 - 1.00 mg/dL 0.88  Sodium 135 - 145 mmol/L 134(L)  Potassium 3.5 - 5.1 mmol/L 3.9  Chloride 98 - 111 mmol/L 96(L)  CO2 22 - 32 mmol/L 23  Calcium 8.9 - 10.3 mg/dL 10.4(H)  Total Protein 6.5 - 8.1 g/dL -  Total Bilirubin 0.3 - 1.2 mg/dL -  Alkaline Phos 38 - 126 U/L -  AST 15 - 41 U/L -  ALT 0 - 44 U/L -   CBC Latest Ref Rng & Units 11/02/2017  WBC 3.6 - 11.0 K/uL 13.2(H)  Hemoglobin 12.0 - 16.0 g/dL 10.9(L)  Hematocrit 35.0 - 47.0 % 32.2(L)  Platelets 150 - 440 K/uL 353    No images are attached to the encounter.  Mr Jeri Cos Wo Contrast  Result Date: 11/07/2017 CLINICAL DATA:  62 y/o F; history of right-sided stroke in 2015 as well as metastatic colon cancer. Sudden onset right-sided weakness 5 days ago. EXAM: MRI HEAD WITHOUT AND WITH CONTRAST TECHNIQUE: Multiplanar, multiecho pulse sequences of the brain and surrounding structures were obtained without and with intravenous contrast. CONTRAST:  6.5 cc Gadavist COMPARISON:  None. FINDINGS: Brain: Chronic hemosiderin stained infarction of the right insula, lentiform nucleus, and corona radiata with wallerian degeneration extending into the right cerebral peduncle and brainstem. Additional small foci of susceptibility hypointensity are present within the left temporal and occipital lobes which may represent hemosiderin deposition of chronic microhemorrhage or tiny cavernoma. Mildly increased T2 and decreased T1 signal within the left caudate head, anterior lentiform nucleus, and insula (series 15, image 14 and 16) with  intermediate diffusion on ADC (series 5, image 33 and series 6, image 33). After administration of intravenous contrast there is no abnormal enhancement of the brain. No hydrocephalus, extra-axial collection, mass effect, or herniation. Scattered nonspecific T2 FLAIR hyperintensities in subcortical and periventricular white matter are compatible with moderate chronic microvascular ischemic changes for age. Moderate volume loss of the brain. Vascular: Normal flow voids. Skull and upper cervical spine: Normal marrow signal. Sinuses/Orbits: Negative. Other: None. IMPRESSION: 1. Increased signal within left caudate head, anterior lentiform nucleus, and insula with intermediate diffusion. Findings may represent subacute infarction. Differential includes hypoxic ischemic or toxic metabolic injury. 2. No intracranial metastasis identified. 3. Chronic infarct in the right insula and basal ganglia. Moderate chronic microvascular ischemic changes and volume loss of the brain. Electronically Signed   By: Kristine Garbe M.D.   On: 11/07/2017 00:48   Nm Bone Scan Whole Body  Result Date: 11/02/2017 CLINICAL DATA:  Colon cancer, bone metastases EXAM: NUCLEAR MEDICINE WHOLE BODY BONE SCAN TECHNIQUE: Whole body anterior and posterior images were obtained approximately 3 hours after intravenous injection of radiopharmaceutical. RADIOPHARMACEUTICALS:  23.273 mCi Technetium-27mMDP IV COMPARISON:  None Correlation: CT chest abdomen pelvis 10/02/2017 FINDINGS: Multiple sites of abnormal osseous tracer accumulation are identified consistent with osseous metastatic disease. These include calvarium, proximal and mid RIGHT humerus, distal sternum, midthoracic and upper lumbar spine, posterior RIGHT rib, multiple sites in pelvis bilaterally, and BILATERAL proximal femora greater on RIGHT. Somewhat pronounced renal cortical retention of tracer bilaterally suggesting renal dysfunction. Expected soft tissue distribution of  tracer. IMPRESSION: Multiple osseous metastases as above, including BILATERAL femora and RIGHT humerus as discussed above. Electronically Signed   By: MLavonia DanaM.D.   On: 11/02/2017 17:28    Assessment and  plan- Patient is a 62 y.o. female diagnosed with metastatic colon cancer who [facility] for complaints of bone pain.   1.  Colon cancer-history of stage III colon cancer in 2018 s/p adjuvant chemotherapy which she did not complete with evidence of diffuse bone metastases (see below) causing significant pain (see below).  MRI of brain in setting of increased weakness reviewed with patient today which was negative for brain metastases. Per Dr. Janese Banks, will consider FOLFIRI but will require port for administration. Port currently held d/t acute pain.   2.  Bone metastases-again discussed role of bisphosphonates and bone metastases with possible risk of osteonecrosis of the jaw.  Based on performance status and acute pain, family and patient acknowledge risk and verbalized desire to move forward with Zometa today without dental clearance.  Ca 10.4 today. Acceptable to proceed with zometa today.   3.  Cancer related pain-worse/uncontrolled. currently receiving palliative radiation to sacrum and bilateral hips.  Unfortunately pain has not improved.  Advised that this can take up to 2 weeks to improve symptoms.  She has had significant difficulty in obtaining fentanyl patches due to insurance, therefore we will switch her to MS Contin 15 mg BID for long acting pain. Continue morphine IR 15 mg (1/2 - 1 tablet) for short acting/breakthrough pain control.   Patient received IV morphine in clinic today, initially morphine 4 mg IV followed by morphine 2 mg IV.  Did not receive significant pain improvement.  Pain was improved with repositioning.  Start long-acting morphine and will re-evaluate.   4.  Altered mobility-baseline alterations to mobility due to prior history of stroke but was  recently worse- previously able to ambulate short distances and stand without assistance.  Now, due to pain and weakness, unable to carry out ADLs or stand.  Requiring increased assistance and significant limitations for DME d/t living in RV.  Discussed palliative care specifically home-based services which she is accepting of referral.  Could also consider home health but I question insurance coverage.   5.  Weakness- worse; ecog 3. multi-factoral- poor oral intake, pain, deconditioning - Cr improved with fluids last week. Cr 0.88, BUN 11 today. IV fluids given in clinic today.   6. Opiate Induced Constipation- In setting of opiate pain medication, discussed risks of opiate-induced constipation.  She has colostomy with good output.  Continue to monitor.   7. Goals of Care- I introduced palliative care as specialized medical care for people living with serious illness. It focuses on providing relief from the symptoms and stress of a serious illness. The goal is to improve quality of life for both the patient and the family.  She requests that we send referral to home based palliative care today due to transporting/moving her causes significant pain and burden to the family.  Could also consider clinic based palliative care referral in the future if pain more controlled or in junction with another appointment. Referral to [facility] sent today.   History and exam findings discussed with Dr. Janese Banks who contributed to and agreed to plan of care.    RTC as scheduled on 11/12/17 for re-evaluation with Dr. Janese Banks. If pain control improved, could consider scheduling port.   Visit Diagnosis 1. Malignant neoplasm of colon, unspecified part of colon ([place])   2. Bone metastases ([place])   3. Cancer related pain   4. Altered mobility due to old stroke   5. Weakness generalized   6. Goals of  care, counseling/discussion     Patient expressed understanding and was in agreement with this  plan. She also understands that She can call clinic at any time with any questions, concerns, or complaints.   Thank you for allowing me to participate in the care of this very pleasant patient.   Beckey Rutter, DNP, AGNP-C [place] at [facility] [phone] (work cell) [phone] (office)

## 2017-11-09 NOTE — Telephone Encounter (Signed)
Husband called to ask if patch has been approved yet.  I called him back and said that I did not get approval yet , I even called [employer] and asked them to see if it would go through today and it would not. I asked him if he started pt on steroids yet and he said no. He was confused thinking that it was around chemo and it is not. I told him to start giving it to her to see if there is nerve involvement with the sacral mass that steroids could dec. Inflammation and hopefully help with some pain relief. He said he would start it today. I asked him if he had made decision on zometa and he said not yet. They are on the way to see [facility] clinic. I also asked to see if he would like [place] to get pt through H/H and he will think about that. He wanted to talk about getting pain management and I offered palliative care and he would like to discuss it while he is there today but I told him I am in [place] today. I told him to let me know or tell Doni and I can do it.

## 2017-11-10 ENCOUNTER — Ambulatory Visit: Payer: BLUE CROSS/BLUE SHIELD

## 2017-11-10 ENCOUNTER — Ambulatory Visit
Admission: RE | Admit: 2017-11-10 | Discharge: 2017-11-10 | Disposition: A | Discharge status: Home / Self Care | Payer: BLUE CROSS/BLUE SHIELD | Source: Ambulatory Visit | Attending: Radiation Oncology | Admitting: Radiation Oncology

## 2017-11-10 ENCOUNTER — Inpatient Hospital Stay: Payer: BLUE CROSS/BLUE SHIELD

## 2017-11-10 ENCOUNTER — Inpatient Hospital Stay: Payer: BLUE CROSS/BLUE SHIELD | Admitting: Oncology

## 2017-11-10 ENCOUNTER — Telehealth: Payer: Self-pay | Admitting: *Deleted

## 2017-11-10 ENCOUNTER — Ambulatory Visit: Payer: BLUE CROSS/BLUE SHIELD | Admitting: Physical Therapy

## 2017-11-10 DIAGNOSIS — C7951 Secondary malignant neoplasm of bone: Secondary | ICD-10-CM | POA: Diagnosis not present

## 2017-11-10 NOTE — Telephone Encounter (Signed)
Called [employer] today to check on the status of the fentanyl patch.  I was told that it is in review and it will be reported out by October 11.  I told person on the phone that we had injured it on 10 4 and it should have been expedited due to patient in severe pain.  Person on the phone says it had not been expedited and he put me on hold and talk to the department to make an expedited and it still will be October 11 with a decision.

## 2017-11-11 ENCOUNTER — Ambulatory Visit: Payer: BLUE CROSS/BLUE SHIELD

## 2017-11-12 ENCOUNTER — Ambulatory Visit: Payer: BLUE CROSS/BLUE SHIELD

## 2017-11-12 ENCOUNTER — Encounter: Payer: Self-pay | Admitting: Oncology

## 2017-11-12 ENCOUNTER — Ambulatory Visit: Payer: BLUE CROSS/BLUE SHIELD | Admitting: Physical Therapy

## 2017-11-12 ENCOUNTER — Telehealth: Payer: Self-pay | Admitting: *Deleted

## 2017-11-12 ENCOUNTER — Inpatient Hospital Stay (HOSPITAL_BASED_OUTPATIENT_CLINIC_OR_DEPARTMENT_OTHER): Payer: BLUE CROSS/BLUE SHIELD | Admitting: Oncology

## 2017-11-12 VITALS — BP 120/82 | HR 111 | Temp 97.6°F | Resp 18 | Wt 133.0 lb

## 2017-11-12 DIAGNOSIS — K5903 Drug induced constipation: Secondary | ICD-10-CM

## 2017-11-12 DIAGNOSIS — Z933 Colostomy status: Secondary | ICD-10-CM

## 2017-11-12 DIAGNOSIS — F5089 Other specified eating disorder: Secondary | ICD-10-CM

## 2017-11-12 DIAGNOSIS — R269 Unspecified abnormalities of gait and mobility: Secondary | ICD-10-CM

## 2017-11-12 DIAGNOSIS — C7951 Secondary malignant neoplasm of bone: Secondary | ICD-10-CM | POA: Diagnosis not present

## 2017-11-12 DIAGNOSIS — C189 Malignant neoplasm of colon, unspecified: Secondary | ICD-10-CM

## 2017-11-12 DIAGNOSIS — G893 Neoplasm related pain (acute) (chronic): Secondary | ICD-10-CM

## 2017-11-12 DIAGNOSIS — I69398 Other sequelae of cerebral infarction: Secondary | ICD-10-CM | POA: Insufficient documentation

## 2017-11-12 DIAGNOSIS — Z79899 Other long term (current) drug therapy: Secondary | ICD-10-CM

## 2017-11-12 MED ORDER — MORPHINE SULFATE 30 MG PO TABS: 30.0000 mg | ORAL_TABLET | ORAL | 0 refills | Status: DC | PRN

## 2017-11-12 MED ORDER — OLANZAPINE 5 MG PO TABS: 5.0000 mg | ORAL_TABLET | Freq: Every day | ORAL | 1 refills | Status: DC

## 2017-11-12 NOTE — Progress Notes (Signed)
Pt, husband and daughter in for follow up and scan results.

## 2017-11-12 NOTE — Telephone Encounter (Signed)
Increasing dose of morphine Added zyprexa for appetite

## 2017-11-13 ENCOUNTER — Ambulatory Visit: Payer: BLUE CROSS/BLUE SHIELD

## 2017-11-13 NOTE — Progress Notes (Signed)
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   She had presented with abdominal pain nausea vomiting and intermittent rectal bleeding in April 2018 and was found to have small bowel obstruction at that time.  Patient had a total colectomy on 06/04/2016 pathology showed invasive adenocarcinoma ring-type involving the cecum and the ascending colon. Tubulovillous adenoma with high-grade dysplasia in the ascending colon. 2 small tubular adenomas in the ascending colon. One inflammatory polyp in the ascending colon. Serrated adenoma in the transverse colon. 8 out of 19 pericolonic lymph nodes were positive for tumor. No loss of nuclear expression of MMR proteins. Tumor was poorly differentiated and invades through the muscularis propria into pericolonic tissue. No microscopy perforation. L VIN PNI were present. Margins were negative. pT3pN2b  Patient had a complicated postoperative course following the colectomy with postoperative ileus C. difficile colitis and was admitted in the ICU for septic shock with pressors. There was a considerable delay because of that in starting adjuvant chemotherapy. CBC back in July 2018 was  normal with a white count of 10.7, H&H of 12/37 and a platelet count of 445. LFTs were within normal limits and serum creatinine was 0.6. CT chest abdomen and pelvis in July 2018 did not reveal any evidence of recurrent or metastatic disease.  Patient was also seen by GYN for thickened endometrium and underwent transvaginal ultrasound in August 2018 which showed a 2.8 cm fibroid as well as endometrial thickening of 12.8 mm. Plan was for hysteroscopy and D&C in September 2018  Adjuvant chemotherapy with FOLFOX was planned every 2 weeks for 12 cycles which started in August 2018. Adjuvant chemotherapy was initiated on 09/30/2016 and from review of records so far I see that her last chemotherapy was cycle #5 that was given on 11/27/2016 after that chemo patient was admitted to the hospital on 12/07/2016 with renal failure and failure to thrive. Patient did not desire further chemo at that point  Patient has [place] from [place] to be with her daughter and son-in-law but is not currently sure how long she will stay here. She has lived in an [place] for the last 20 years.   Routine surveillance scans done for colon cancer on 10/02/2017 revealed large destructive lesion in the left sacrum consistent with metastatic colon cancer.outside colon cancer resection slides were obtained for our path review and was consistent with signet ring cell carcinoma. MSI stable. No evidence of KRAS, NRAS or BRAF mutation  Patient was sent from IV to radiation to the cecal mass but continued to complain of worsening low back pain as well as pain in the right leg while ambulating.  This was followed by a bone scan which showed multiple bone lesions including the calvarium proximal and mid right humerus, distal sternum, mid thoracic and upper lumbar  spine, posterior right rib, multiple sites in the pelvis as well as bilateral proximal femoral greater on the right.  Interval history-patient was started on morphine  long-acting 15 mg twice a day 4 days ago.  She also continues to take morphine short-acting 15 mg IR every 4 hours as needed for pain.  Patient states that her pain is a little better but not to the point that she can be comfortable.  She continues to have problems ambulating and is pretty much a total assist for getting out of the chair to do any kind of ADLs.  She has also been experiencing some constipation and her stool forms a cake at the ostomy orifice.  Her appetite continues to be poor and she is eating very little.  ECOG PS- 3 Pain scale- 4 Opioid associated constipation- yes  Review of systems- Review of Systems  Constitutional: Positive for malaise/fatigue and weight loss. Negative for chills and fever.       Lack of appetite  HENT: Negative for congestion, ear discharge and nosebleeds.   Eyes: Negative for blurred vision.  Respiratory: Negative for cough, hemoptysis, sputum production, shortness of breath and wheezing.   Cardiovascular: Negative for chest pain, palpitations, orthopnea and claudication.  Gastrointestinal: Negative for abdominal pain, blood in stool, constipation, diarrhea, heartburn, melena, nausea and vomiting.  Genitourinary: Negative for dysuria, flank pain, frequency, hematuria and urgency.  Musculoskeletal: Positive for back pain and joint pain. Negative for myalgias.  Skin: Negative for rash.  Neurological: Negative for dizziness, tingling, focal weakness, seizures, weakness and headaches.  Endo/Heme/Allergies: Does not bruise/bleed easily.  Psychiatric/Behavioral: Negative for depression and suicidal ideas. The patient does not have insomnia.       No Known Allergies   Past Medical History:  Diagnosis Date  . Colon cancer ([place])   . Diplopia 07/05/2014   Overview:  Converted from Centricity: Description - DIPLOPIA  . Dysphagia, oropharyngeal phase 09/20/2013   Overview:  Converted from Centricity: Description - DYSPHAGIA, OROPHARYNGEAL PHASE  .  Essential (primary) hypertension 09/20/2013   Overview:  Converted from Centricity: Description - HYPERTENSION, BENIGN ESSENTIAL  . GERD (gastroesophageal reflux disease)   . Hyperlipidemia   . Hypertension   . Malignant neoplasm of colon ([place]) 01/23/2017  . Stroke ([place])   . Tinea unguium 09/20/2013   Overview:  Converted from Centricity: Description - ONYCHOMYCOSIS, TOENAILS     Past Surgical History:  Procedure Laterality Date  . COLOSTOMY    . FLEXIBLE SIGMOIDOSCOPY N/A 05/14/2017   Procedure: FLEXIBLE SIGMOIDOSCOPY;  Surgeon: Lin Landsman, MD;  Location: [facility];  Service: Gastroenterology;  Laterality: N/A;  . PORT A CATH INJECTION ([place] [facility]) Right    7/18    Social History   Socioeconomic History  . Marital status: Married    Spouse name: Not on file  . Number of children: Not on file  . Years of education: Not on file  . Highest education level: Not on file  Occupational History  . Not on file  Social Needs  . Financial resource strain: Not on file  . Food insecurity:    Worry: Not on file    Inability: Not on file  . Transportation needs:    Medical: Not on file    Non-medical: Not on file  Tobacco Use  . Smoking status: Never Smoker  . Smokeless tobacco: Never Used  Substance and Sexual Activity  . Alcohol use: No  . Drug use: No  . Sexual activity: Not on file  Lifestyle  . Physical activity:    Days per week: Not on file    Minutes per session: Not on file  . Stress: Not on file  Relationships  . Social connections:    Talks on phone: Not on file    Gets together: Not on file    Attends religious service: Not on file    Active member of club or organization: Not on file    Attends meetings of clubs or organizations: Not on file    Relationship status: Not on file  . Intimate partner violence:    Fear of current or ex partner: Not on file    Emotionally abused: Not on file    Physically abused: Not on file    Forced sexual activity:  Not on file  Other Topics Concern  . Not on file  Social History Narrative  . Not on file    Family History  Problem Relation Age of Onset  . Throat cancer Brother   . Stroke Mother   . Lung cancer Father      Current Outpatient Medications:  .  acetaminophen (TYLENOL) 325 MG tablet, Take 650 mg by mouth every 6 (six) hours as needed., Disp: , Rfl:  .  atorvastatin (LIPITOR) 20 MG tablet, Take 1 tablet (20 mg total) by mouth daily at 6 PM., Disp: 90 tablet, Rfl: 1 .  dexamethasone (DECADRON) 4 MG tablet, Take 2 tablets (8 mg total) by mouth daily. With food, Disp: 14 tablet, Rfl: 0 .  docusate sodium (COLACE) 250 MG capsule, Take 250 mg by mouth daily., Disp: , Rfl:  .  losartan (COZAAR) 25 MG tablet, Take 1 tablet (25 mg total) by mouth daily., Disp: 90 tablet, Rfl: 1 .  morphine (MS CONTIN) 15 MG 12 hr tablet, Take 1 tablet (15 mg total) by mouth every 12 (twelve) hours for 7 days., Disp: 14 tablet, Rfl: 0 .  Multiple Vitamin (MULTIVITAMIN WITH MINERALS) TABS tablet, Take 1 tablet by mouth daily., Disp: , Rfl:  .  fentaNYL (DURAGESIC - DOSED MCG/HR) 12 MCG/HR, Place 1 patch (12.5 mcg total) onto the skin every 3 (three) days. (Patient not taking: Reported on 11/12/2017), Disp: 10 patch, Rfl: 0 .  loperamide (IMODIUM A-D) 2 MG tablet, Take 1 tablet (2 mg total) by mouth 3 (three) times daily as needed. Take 2 at onset , then 1 every 2hr with each BM. Max dose 16 mg (Patient not taking: Reported on 10/28/2017), Disp: 100 tablet, Rfl: 1 .  LORazepam (ATIVAN) 1 MG tablet, Take 1 tablet (1 mg total) by mouth every 6 (six) hours as needed (NAUSEA). (Patient not taking: Reported on 10/28/2017), Disp: 30 tablet, Rfl: 0 .  morphine (MSIR) 30 MG tablet, Take 1 tablet (30 mg total) by mouth every 4 (four) hours as needed for severe pain., Disp: 180 tablet, Rfl: 0 .  OLANZapine (ZYPREXA) 5 MG tablet, Take 1 tablet (5 mg total) by mouth at bedtime., Disp: 30 tablet, Rfl: 1 .  ondansetron (ZOFRAN) 8 MG  tablet, Take 1 tablet (8 mg total) by mouth 2 (two) times daily as needed for refractory nausea / vomiting. Start on day 3 after chemotherapy. (Patient not taking: Reported on 10/28/2017), Disp: 30 tablet, Rfl: 1 .  prochlorperazine (COMPAZINE) 10 MG tablet, Take 1 tablet (10 mg total) by mouth every 6 (six) hours as needed (NAUSEA). (Patient not taking: Reported on 10/28/2017), Disp: 30 tablet, Rfl: 1  Physical exam:  Vitals:   11/12/17 0908  BP: 120/82  Pulse: (!) 111  Resp: 18  Temp: 97.6 F (36.4 C)  TempSrc: Tympanic  Weight: 133 lb (60.3 kg)   Physical Exam  Constitutional: She is oriented to person, place, and time.  Patient is thin and fatigued.  She is sitting in a wheelchair and appears in mild distress  HENT:  Head: Normocephalic and atraumatic.  Eyes: Pupils are equal, round, and reactive to light. EOM are normal.  Neck: Normal range of motion.  Cardiovascular: Normal rate, regular rhythm and normal heart sounds.  Pulmonary/Chest: Effort normal and breath sounds normal.  Abdominal: Soft. Bowel sounds are normal.  Colostomy in place there is evidence of solid stool at the ostomy.  Colostomy bag has a green liquid with the patient's husband states is a diet to keep foul odor away  Neurological: She is alert and oriented to person, place, and time.  Left-sided hemiplegia  Skin: Skin is warm and dry.     CMP Latest Ref Rng & Units 11/09/2017  Glucose 70 - 99 mg/dL 139(H)  BUN 8 - 23 mg/dL 11  Creatinine 0.44 - 1.00 mg/dL 0.88  Sodium 135 - 145 mmol/L 134(L)  Potassium 3.5 - 5.1 mmol/L 3.9  Chloride 98 - 111 mmol/L 96(L)  CO2 22 - 32 mmol/L 23  Calcium 8.9 - 10.3 mg/dL 10.4(H)  Total Protein 6.5 - 8.1 g/dL -  Total Bilirubin 0.3 - 1.2 mg/dL -  Alkaline Phos 38 - 126 U/L -  AST 15 - 41 U/L -  ALT 0 - 44 U/L -   CBC Latest Ref Rng & Units 11/02/2017  WBC 3.6 - 11.0 K/uL 13.2(H)  Hemoglobin 12.0 - 16.0 g/dL 10.9(L)  Hematocrit 35.0 - 47.0 % 32.2(L)  Platelets 150 -  440 K/uL 353    No images are attached to the encounter.  Mr Jeri Cos Wo Contrast  Result Date: 11/07/2017 CLINICAL DATA:  62 y/o F; history of right-sided stroke in 2015 as well as metastatic colon cancer. Sudden onset right-sided weakness 5 days ago. EXAM: MRI HEAD WITHOUT AND WITH CONTRAST TECHNIQUE: Multiplanar, multiecho pulse sequences of the brain and surrounding structures were obtained without and with intravenous contrast. CONTRAST:  6.5 cc Gadavist COMPARISON:  None. FINDINGS: Brain: Chronic hemosiderin stained infarction of the right insula, lentiform nucleus, and corona radiata with wallerian degeneration extending into the right cerebral peduncle and brainstem. Additional small foci of susceptibility hypointensity are present within the left temporal and occipital lobes which may represent hemosiderin deposition of chronic microhemorrhage or tiny cavernoma. Mildly increased T2 and decreased T1 signal within the left caudate head, anterior lentiform nucleus, and insula (series 15, image 14 and 16) with intermediate diffusion on ADC (series 5, image 33 and series 6, image 33). After administration of intravenous contrast there is no abnormal enhancement of the brain. No hydrocephalus, extra-axial collection, mass effect, or herniation. Scattered nonspecific T2 FLAIR hyperintensities in subcortical and periventricular white matter are compatible with moderate chronic microvascular ischemic changes for age. Moderate volume loss of the brain. Vascular: Normal flow voids. Skull and upper cervical spine: Normal marrow signal. Sinuses/Orbits: Negative. Other: None. IMPRESSION: 1. Increased signal within left caudate head, anterior lentiform nucleus, and insula with intermediate diffusion. Findings may represent subacute infarction. Differential includes hypoxic ischemic or toxic metabolic injury. 2. No intracranial metastasis identified. 3. Chronic infarct in the right insula and basal ganglia. Moderate  chronic microvascular ischemic changes and volume loss of the brain. Electronically Signed   By: Kristine Garbe  M.D.   On: 11/07/2017 00:48   Nm Bone Scan Whole Body  Result Date: 11/02/2017 CLINICAL DATA:  Colon cancer, bone metastases EXAM: NUCLEAR MEDICINE WHOLE BODY BONE SCAN TECHNIQUE: Whole body anterior and posterior images were obtained approximately 3 hours after intravenous injection of radiopharmaceutical. RADIOPHARMACEUTICALS:  23.273 mCi Technetium-63mMDP IV COMPARISON:  None Correlation: CT chest abdomen pelvis 10/02/2017 FINDINGS: Multiple sites of abnormal osseous tracer accumulation are identified consistent with osseous metastatic disease. These include calvarium, proximal and mid RIGHT humerus, distal sternum, midthoracic and upper lumbar spine, posterior RIGHT rib, multiple sites in pelvis bilaterally, and BILATERAL proximal femora greater on RIGHT. Somewhat pronounced renal cortical retention of tracer bilaterally suggesting renal dysfunction. Expected soft tissue distribution of tracer. IMPRESSION: Multiple osseous metastases as above, including BILATERAL femora and RIGHT humerus as discussed above. Electronically Signed   By: MLavonia DanaM.D.   On: 11/02/2017 17:28     Assessment and plan- Patient is a 62y.o. female with history of stage III colon cancer in 2018 status post incomplete adjuvant FOLFOX chemotherapy.  She now has evidence of diffuse bone metastases  1.  Neoplasm related pain: Patient will continue morphine SR 15 twice daily and have increased her morphine IR to 30 mg every 4 hours as needed.  Patient's family will call [place] 11/16/2017 to tell u[place]ore[place]ow her pain is doing and if her pain remains uncontrolled I will consider going up on morphine SR.  I will also consider adding Lyrica 75 mg twice daily as an adjuvant nausea 6.  2.  I have asked patient to try MiraLAX and 2 tablets of senna at night to counter opioid associated constipation.  3.  We have made  a referral to home-based palliative care and they are yet to see the patient to be of further assistance at home.  She may also benefit from home physical therapy  4.  I will reassess the patient in 1 week's time and see how her pain is doing.  If we are able to make some significant progress in her pain management we will consider proceeding with palliative chemotherapy at that time for which she would need port placement  5.  Lack of appetite: I have prescribed Zyprexa 5 mg at night to see if it helps  6.  Patient and family understand that overall her prognosis is poor and currently her performance status does not permit starting chemotherapy at this time unless her pain is better controlled and she is able to ambulate better.  She does have some baseline stroke and left-sided hemi-plegia which limits her ambulation  7.  Patient's home blood pressure readings are currently in the 120s and her husband has restarted her losartan.  We will continue to monitor that   Total face to face encounter time for this patient visit was 45 min. >50% of the time was  spent in counseling and coordination of care.     Visit Diagnosis 1. Malignant neoplasm of colon, unspecified part of colon ([place]   2. Neoplasm related pain      Dr. ARanda Evens MD, MPH [id number]/12/2017 2:48 PM

## 2017-11-15 ENCOUNTER — Other Ambulatory Visit: Payer: Self-pay

## 2017-11-15 ENCOUNTER — Emergency Department: Payer: BLUE CROSS/BLUE SHIELD

## 2017-11-15 ENCOUNTER — Inpatient Hospital Stay
Admission: EM | Admit: 2017-11-15 | Discharge: 2017-11-18 | DRG: 871 | Disposition: A | Discharge status: Hospice | Payer: BLUE CROSS/BLUE SHIELD | Attending: Family Medicine | Admitting: Family Medicine

## 2017-11-15 DIAGNOSIS — Z932 Ileostomy status: Secondary | ICD-10-CM

## 2017-11-15 DIAGNOSIS — Z66 Do not resuscitate: Secondary | ICD-10-CM | POA: Diagnosis present

## 2017-11-15 DIAGNOSIS — I10 Essential (primary) hypertension: Secondary | ICD-10-CM | POA: Diagnosis present

## 2017-11-15 DIAGNOSIS — K219 Gastro-esophageal reflux disease without esophagitis: Secondary | ICD-10-CM | POA: Diagnosis present

## 2017-11-15 DIAGNOSIS — R532 Functional quadriplegia: Secondary | ICD-10-CM | POA: Diagnosis present

## 2017-11-15 DIAGNOSIS — I959 Hypotension, unspecified: Secondary | ICD-10-CM | POA: Diagnosis present

## 2017-11-15 DIAGNOSIS — Z993 Dependence on wheelchair: Secondary | ICD-10-CM | POA: Diagnosis not present

## 2017-11-15 DIAGNOSIS — Z9221 Personal history of antineoplastic chemotherapy: Secondary | ICD-10-CM

## 2017-11-15 DIAGNOSIS — R1312 Dysphagia, oropharyngeal phase: Secondary | ICD-10-CM | POA: Diagnosis present

## 2017-11-15 DIAGNOSIS — K760 Fatty (change of) liver, not elsewhere classified: Secondary | ICD-10-CM | POA: Diagnosis present

## 2017-11-15 DIAGNOSIS — L89021 Pressure ulcer of left elbow, stage 1: Secondary | ICD-10-CM | POA: Diagnosis present

## 2017-11-15 DIAGNOSIS — G893 Neoplasm related pain (acute) (chronic): Secondary | ICD-10-CM | POA: Diagnosis present

## 2017-11-15 DIAGNOSIS — L89151 Pressure ulcer of sacral region, stage 1: Secondary | ICD-10-CM | POA: Diagnosis present

## 2017-11-15 DIAGNOSIS — Z515 Encounter for palliative care: Secondary | ICD-10-CM | POA: Diagnosis present

## 2017-11-15 DIAGNOSIS — Z7401 Bed confinement status: Secondary | ICD-10-CM

## 2017-11-15 DIAGNOSIS — E872 Acidosis: Secondary | ICD-10-CM | POA: Diagnosis present

## 2017-11-15 DIAGNOSIS — E785 Hyperlipidemia, unspecified: Secondary | ICD-10-CM | POA: Diagnosis present

## 2017-11-15 DIAGNOSIS — A419 Sepsis, unspecified organism: Secondary | ICD-10-CM

## 2017-11-15 DIAGNOSIS — R945 Abnormal results of liver function studies: Secondary | ICD-10-CM

## 2017-11-15 DIAGNOSIS — Z923 Personal history of irradiation: Secondary | ICD-10-CM

## 2017-11-15 DIAGNOSIS — R6520 Severe sepsis without septic shock: Secondary | ICD-10-CM

## 2017-11-15 DIAGNOSIS — Z79899 Other long term (current) drug therapy: Secondary | ICD-10-CM | POA: Diagnosis not present

## 2017-11-15 DIAGNOSIS — C7951 Secondary malignant neoplasm of bone: Secondary | ICD-10-CM | POA: Diagnosis present

## 2017-11-15 DIAGNOSIS — A4150 Gram-negative sepsis, unspecified: Secondary | ICD-10-CM | POA: Diagnosis present

## 2017-11-15 DIAGNOSIS — I69354 Hemiplegia and hemiparesis following cerebral infarction affecting left non-dominant side: Secondary | ICD-10-CM

## 2017-11-15 DIAGNOSIS — C189 Malignant neoplasm of colon, unspecified: Secondary | ICD-10-CM | POA: Diagnosis present

## 2017-11-15 DIAGNOSIS — G629 Polyneuropathy, unspecified: Secondary | ICD-10-CM | POA: Diagnosis present

## 2017-11-15 DIAGNOSIS — N179 Acute kidney failure, unspecified: Secondary | ICD-10-CM | POA: Diagnosis present

## 2017-11-15 DIAGNOSIS — Z9049 Acquired absence of other specified parts of digestive tract: Secondary | ICD-10-CM

## 2017-11-15 DIAGNOSIS — L8990 Pressure ulcer of unspecified site, unspecified stage: Secondary | ICD-10-CM

## 2017-11-15 DIAGNOSIS — N390 Urinary tract infection, site not specified: Secondary | ICD-10-CM

## 2017-11-15 DIAGNOSIS — R7989 Other specified abnormal findings of blood chemistry: Secondary | ICD-10-CM

## 2017-11-15 DIAGNOSIS — Z79891 Long term (current) use of opiate analgesic: Secondary | ICD-10-CM

## 2017-11-15 DIAGNOSIS — R0602 Shortness of breath: Secondary | ICD-10-CM

## 2017-11-15 DIAGNOSIS — R791 Abnormal coagulation profile: Secondary | ICD-10-CM | POA: Diagnosis present

## 2017-11-15 DIAGNOSIS — J189 Pneumonia, unspecified organism: Secondary | ICD-10-CM

## 2017-11-15 LAB — COMPREHENSIVE METABOLIC PANEL
ALK PHOS: 469 U/L — AB (ref 38–126)
ALT: 69 U/L — ABNORMAL HIGH (ref 0–44)
ANION GAP: 17 — AB (ref 5–15)
AST: 239 U/L — ABNORMAL HIGH (ref 15–41)
Albumin: 2.4 g/dL — ABNORMAL LOW (ref 3.5–5.0)
BUN: 25 mg/dL — ABNORMAL HIGH (ref 8–23)
CALCIUM: 7.3 mg/dL — AB (ref 8.9–10.3)
CHLORIDE: 98 mmol/L (ref 98–111)
CO2: 23 mmol/L (ref 22–32)
Creatinine, Ser: 3.85 mg/dL — ABNORMAL HIGH (ref 0.44–1.00)
GFR calc non Af Amer: 11 mL/min — ABNORMAL LOW (ref >60)
GFR, EST AFRICAN AMERICAN: 13 mL/min — AB (ref >60)
Glucose, Bld: 118 mg/dL — ABNORMAL HIGH (ref 70–99)
Potassium: 4.3 mmol/L (ref 3.5–5.1)
SODIUM: 138 mmol/L (ref 135–145)
Total Bilirubin: 4.4 mg/dL — ABNORMAL HIGH (ref 0.3–1.2)
Total Protein: 6.7 g/dL (ref 6.5–8.1)

## 2017-11-15 LAB — CBC WITH DIFFERENTIAL/PLATELET
ABS IMMATURE GRANULOCYTES: 1.62 10*3/uL — AB (ref 0.00–0.07)
Basophils Absolute: 0.1 10*3/uL (ref 0.0–0.1)
Basophils Relative: 1 %
Eosinophils Absolute: 0.4 10*3/uL (ref 0.0–0.5)
Eosinophils Relative: 3 %
HEMATOCRIT: 32.5 % — AB (ref 36.0–46.0)
HEMOGLOBIN: 10.4 g/dL — AB (ref 12.0–15.0)
Immature Granulocytes: 10 %
Lymphocytes Relative: 6 %
Lymphs Abs: 1 10*3/uL (ref 0.7–4.0)
MCH: 30.6 pg (ref 26.0–34.0)
MCHC: 32 g/dL (ref 30.0–36.0)
MCV: 95.6 fL (ref 80.0–100.0)
MONO ABS: 0.9 10*3/uL (ref 0.1–1.0)
MONOS PCT: 5 %
NEUTROS ABS: 12.1 10*3/uL — AB (ref 1.7–7.7)
Neutrophils Relative %: 75 %
Platelets: 238 10*3/uL (ref 150–400)
RBC: 3.4 MIL/uL — AB (ref 3.87–5.11)
RDW: 14.1 % (ref 11.5–15.5)
Smear Review: UNDETERMINED
WBC: 16.2 10*3/uL — ABNORMAL HIGH (ref 4.0–10.5)
nRBC: 0.1 % (ref 0.0–0.2)

## 2017-11-15 LAB — URINALYSIS, COMPLETE (UACMP) WITH MICROSCOPIC
Bilirubin Urine: NEGATIVE
Glucose, UA: NEGATIVE mg/dL
Ketones, ur: 5 mg/dL — AB
NITRITE: NEGATIVE
PROTEIN: 100 mg/dL — AB
Specific Gravity, Urine: 1.011 (ref 1.005–1.030)
Squamous Epithelial / LPF: NONE SEEN (ref 0–5)
pH: 7 (ref 5.0–8.0)

## 2017-11-15 LAB — LACTIC ACID, PLASMA
LACTIC ACID, VENOUS: 2.2 mmol/L — AB (ref 0.5–1.9)
LACTIC ACID, VENOUS: 1.2 mmol/L (ref 0.5–1.9)

## 2017-11-15 LAB — PROTIME-INR
INR: 1.36
PROTHROMBIN TIME: 16.7 s — AB (ref 11.4–15.2)

## 2017-11-15 MED ORDER — HEPARIN SODIUM (PORCINE) 5000 UNIT/ML IJ SOLN
5000.0000 [IU] | Freq: Three times a day (TID) | INTRAMUSCULAR | Status: DC
  Administered 2017-11-15 – 2017-11-17 (×8): 5000 [IU] via SUBCUTANEOUS
  Filled 2017-11-15 (×9): qty 1

## 2017-11-15 MED ORDER — MORPHINE SULFATE (PF) 4 MG/ML IV SOLN
INTRAVENOUS | Status: AC
  Administered 2017-11-15: 4 mg via INTRAVENOUS
  Filled 2017-11-15: qty 1

## 2017-11-15 MED ORDER — SODIUM CHLORIDE 0.9 % IV BOLUS (SEPSIS)
1000.0000 mL | Freq: Once | INTRAVENOUS | Status: AC
  Administered 2017-11-15: 1000 mL via INTRAVENOUS

## 2017-11-15 MED ORDER — MORPHINE SULFATE (PF) 4 MG/ML IV SOLN
4.0000 mg | Freq: Once | INTRAVENOUS | Status: AC
  Administered 2017-11-15: 4 mg via INTRAVENOUS

## 2017-11-15 MED ORDER — SODIUM CHLORIDE 0.9 % IV SOLN
INTRAVENOUS | Status: DC
  Administered 2017-11-15: 500 mL via INTRAVENOUS
  Administered 2017-11-15 – 2017-11-17 (×5): via INTRAVENOUS

## 2017-11-15 MED ORDER — METRONIDAZOLE IN NACL 5-0.79 MG/ML-% IV SOLN
500.0000 mg | Freq: Three times a day (TID) | INTRAVENOUS | Status: DC
  Administered 2017-11-15 (×2): 500 mg via INTRAVENOUS
  Filled 2017-11-15 (×5): qty 100

## 2017-11-15 MED ORDER — ACETAMINOPHEN 325 MG PO TABS: 650.0000 mg | ORAL_TABLET | Freq: Four times a day (QID) | ORAL | Status: DC | PRN

## 2017-11-15 MED ORDER — VANCOMYCIN HCL IN DEXTROSE 1-5 GM/200ML-% IV SOLN
1000.0000 mg | Freq: Once | INTRAVENOUS | Status: AC
  Administered 2017-11-15: 1000 mg via INTRAVENOUS
  Filled 2017-11-15: qty 200

## 2017-11-15 MED ORDER — ATORVASTATIN CALCIUM 20 MG PO TABS
20.0000 mg | ORAL_TABLET | Freq: Every day | ORAL | Status: DC
  Administered 2017-11-15 – 2017-11-17 (×3): 20 mg via ORAL
  Filled 2017-11-15 (×3): qty 1

## 2017-11-15 MED ORDER — VANCOMYCIN HCL IN DEXTROSE 750-5 MG/150ML-% IV SOLN
750.0000 mg | INTRAVENOUS | Status: DC
  Filled 2017-11-15: qty 150

## 2017-11-15 MED ORDER — SODIUM CHLORIDE 0.9 % IV SOLN
2.0000 g | Freq: Once | INTRAVENOUS | Status: AC
  Administered 2017-11-15: 2 g via INTRAVENOUS
  Filled 2017-11-15: qty 2

## 2017-11-15 MED ORDER — ORAL CARE MOUTH RINSE
15.0000 mL | Freq: Two times a day (BID) | OROMUCOSAL | Status: DC
  Administered 2017-11-15 – 2017-11-18 (×5): 15 mL via OROMUCOSAL

## 2017-11-15 MED ORDER — ADULT MULTIVITAMIN W/MINERALS CH
1.0000 | ORAL_TABLET | Freq: Every day | ORAL | Status: DC
  Administered 2017-11-15 – 2017-11-18 (×3): 1 via ORAL
  Filled 2017-11-15 (×4): qty 1

## 2017-11-15 MED ORDER — MORPHINE SULFATE 15 MG PO TABS
15.0000 mg | ORAL_TABLET | ORAL | Status: DC | PRN
  Administered 2017-11-15 – 2017-11-17 (×5): 15 mg via ORAL
  Filled 2017-11-15 (×5): qty 1

## 2017-11-15 MED ORDER — DEXAMETHASONE 4 MG PO TABS
8.0000 mg | ORAL_TABLET | Freq: Every day | ORAL | Status: DC
  Administered 2017-11-15 – 2017-11-18 (×4): 8 mg via ORAL
  Filled 2017-11-15 (×5): qty 2

## 2017-11-15 MED ORDER — DOCUSATE SODIUM 100 MG PO CAPS: 100.0000 mg | ORAL_CAPSULE | Freq: Two times a day (BID) | ORAL | Status: DC | PRN

## 2017-11-15 MED ORDER — SODIUM CHLORIDE 0.9 % IV SOLN
2.0000 g | INTRAVENOUS | Status: DC
  Administered 2017-11-16: 2 g via INTRAVENOUS
  Filled 2017-11-15 (×2): qty 2

## 2017-11-15 MED ORDER — SODIUM CHLORIDE 0.9 % IV BOLUS
1000.0000 mL | Freq: Once | INTRAVENOUS | Status: AC
  Administered 2017-11-15: 1000 mL via INTRAVENOUS

## 2017-11-15 NOTE — ED Notes (Signed)
Spoke with Dr. Anselm Jungling and informed him that pts blood pressure had dropped to 81/59, per Dr. Anselm Jungling, give pt 500 mL bolus and then decrease rate to 100 mL per hour.

## 2017-11-15 NOTE — Progress Notes (Signed)
The nurse called , patient and husband wanted to talk to me about CODE STATUS again. I went back to the room and spoke to them. They have changed his mind. She would like to have a trial of CPR or defibrillation along with ACLS medications.  She does not want to have intubation or ventilatory support or BiPAP support if needed. I have confirmed this again one by one with patient's husband. We had a brief discussion about quality of life and her medical issues. I will call palliative care consult tomorrow to discuss goals of care. Additional time spent 15 min.

## 2017-11-15 NOTE — ED Notes (Signed)
EDP notified of BP 80/58

## 2017-11-15 NOTE — ED Notes (Addendum)
RN in room with pt, pt had 3 beat run of v tach, RN repeated EKG and gave to MD. Pt blood pressure noted to be 86 systolic, pt placed in slight trendelenburg position. EDP aware.

## 2017-11-15 NOTE — ED Notes (Signed)
Pt had change in heart rate, pt rate went from 150's to 118-120's with blood pressure of 89/55, Dr. Kerman Passey informed and verbal order given for 1 liter NS. Per Dr. Kerman Passey, he will come re evaluate pt.

## 2017-11-15 NOTE — ED Notes (Signed)
Spoke with Dr. Anselm Jungling, Dr. Anselm Jungling states that after speaking with husband, decision was made that if 500 mL bolus of fluids does not bring pts blood pressure up that she will be made comfort care. Per Dr. Anselm Jungling OK for patient to go to the floor.

## 2017-11-15 NOTE — ED Notes (Signed)
Dr. Anselm Jungling at bedside

## 2017-11-15 NOTE — ED Notes (Signed)
Ultrasound in room

## 2017-11-15 NOTE — ED Provider Notes (Signed)
[facility] Emergency Department Provider Note  Time seen: 8:25 AM  I have reviewed the triage vital signs and the nursing notes.   HISTORY  Chief Complaint Code Sepsis    HPI Theresa Brock is a 62 y.o. female with a past medical history of colon cancer status post ostomy, CVA with left-sided weakness, hypertension, gastric reflux, presents to the emergency department for confusion.  According to the patient and EMS report the patient has been very confused today, lives at home with her husband who noticed significant confusion today.  Patient does state she has been hurting across the lower abdomen she thinks that since yesterday.  Unknown fever.  EMS states they found the patient to be hypotensive tachycardic and with a low oxygen saturation.  Patient denies any trouble breathing or chest pain.  No known cough although the patient is confused.  Disoriented to place and time, I do not believe review of systems is accurate at this time.   Past Medical History:  Diagnosis Date  . Colon cancer ([place])   . Diplopia 07/05/2014   Overview:  Converted from Cent[employer]: Description - DIPLOPIA  . Dysphagia, oropharyngeal phase 09/20/2013   Overview:  Converted from Centricity: Description - DYSPHAGIA, OROPHARYNGEAL PHASE  . Essential (primary) hypertension 09/20/2013   Overview:  Converted from Centricity: Description - HYPERTENSION, BENIGN ESSENTIAL  . GERD (gastroesophageal reflux disease)   . Hyperlipidemia   . Hypertension   . Malignant neoplasm of colon ([place]) 01/23/2017  . Stroke ([place])   . Tinea unguium 09/20/2013   Overview:  Converted from Centricity: Description - ONYCHOMYCOSIS, TOENAILS    Patient Active Problem List   Diagnosis Date Noted  . Altered mobility due to old stroke 11/12/2017  . Goals of care, counseling/discussion 11/06/2017  . Cancer related pain 11/02/2017  . Bone metastases ([place]) 10/26/2017  . 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 Malignant  neoplasm of colon ([place]) 01/23/2017  . Encounter for follow-up surveillance of colon cancer 01/23/2017  . Adenocarcinoma ([place]) 06/15/2016  . 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 Abnormal CT scan, gastrointestinal tract 05/26/2016  . Diplopia 07/05/2014  . Constipation 09/20/2013  . Dysphagia, oropharyngeal phase 09/20/2013  . Encounter for general adult medical examination without abnormal findings 09/20/2013  . Essential (primary) hypertension 09/20/2013  . Hemiplegia and hemiparesis following unspecified cerebrovascular disease affecting unspecified side ([place]) 09/20/2013  . Hyperlipidemia 09/20/2013  . Other disorders of intestinal carbohydrate absorption 09/20/2013  . Tinea unguium 09/20/2013    Past Surgical History:  Procedure Laterality Date  . COLOSTOMY    . FLEXIBLE SIGMOIDOSCOPY N/A 05/14/2017   Procedure: FLEXIBLE SIGMOIDOSCOPY;  Surgeon: Lin Landsman, MD;  Location[facility] ENDOSCOPY;  Service: Gastroenterology;  Laterality: N/A;  . PORT A CATH INJECTION ([place] HX) Right    7/18    Prior to Admission medications   Medication Sig Start Date End Date Taking? Authorizing Provider  acetaminophen (TYLENOL) 325 MG tablet Take 650 mg by mouth every 6 (six) hours as needed.    [provider]  atorvastatin (LIPITOR) 20 MG tablet Take 1 tablet (20 mg total) by mouth daily at 6 PM. 06/09/17 12/06/17  Trinna Post, PA-C  dexamethasone (DECADRON) 4 MG tablet Take 2 tablets (8 mg total) by mouth daily. With food 11/06/17   Sindy Guadeloupe, MD  docusate sodium (COLACE) 250 MG capsule Take 250 mg by mouth daily.    [provider]  fentaNYL (DURAGESIC - DOSED MCG/HR) 12 MCG/HR  Place 1 patch (12.5 mcg total) onto the skin every 3 (three) days. Patient not taking: Reported on 11/12/2017 11/05/17   Sindy Guadeloupe, MD  loperamide (IMODIUM A-D) 2 MG tablet Take 1 tablet (2 mg total) by mouth 3 (three) times daily as needed. Take 2 at onset , then 1  every 2hr with each BM. Max dose 16 mg Patient not taking: Reported on 10/28/2017 10/26/17   Sindy Guadeloupe, MD  LORazepam (ATIVAN) 1 MG tablet Take 1 tablet (1 mg total) by mouth every 6 (six) hours as needed (NAUSEA). Patient not taking: Reported on 10/28/2017 10/26/17   Sindy Guadeloupe, MD  losartan (COZAAR) 25 MG tablet Take 1 tablet (25 mg total) by mouth daily. 06/09/17 12/06/17  Trinna Post, PA-C  morphine (MS CONTIN) 15 MG 12 hr tablet Take 1 tablet (15 mg total) by mouth every 12 (twelve) hours for 7 days. 11/09/17 11/16/17  Verlon Au, NP  morphine (MSIR) 30 MG tablet Take 1 tablet (30 mg total) by mouth every 4 (four) hours as needed for severe pain. 11/12/17   Sindy Guadeloupe, MD  Multiple Vitamin (MULTIVITAMIN WITH MINERALS) TABS tablet Take 1 tablet by mouth daily.    [provider]  OLANZapine (ZYPREXA) 5 MG tablet Take 1 tablet (5 mg total) by mouth at bedtime. 11/12/17   Sindy Guadeloupe, MD  ondansetron (ZOFRAN) 8 MG tablet Take 1 tablet (8 mg total) by mouth 2 (two) times daily as needed for refractory nausea / vomiting. Start on day 3 after chemotherapy. Patient not taking: Reported on 10/28/2017 10/26/17   Sindy Guadeloupe, MD  prochlorperazine (COMPAZINE) 10 MG tablet Take 1 tablet (10 mg total) by mouth every 6 (six) hours as needed (NAUSEA). Patient not taking: Reported on 10/28/2017 10/26/17   Sindy Guadeloupe, MD    No Known Allergies  Family History  Problem Relation Age of Onset  . Throat cancer Brother   . Stroke Mother   . Lung cancer Father     Social History Social History   Tobacco Use  . Smoking status: Never Smoker  . Smokeless tobacco: Never Used  Substance Use Topics  . Alcohol use: No  . Drug use: No    Review of Systems Unable to obtain an adequate/accurate review of systems secondary to altered mental status/confusion. ____________________________________________   PHYSICAL EXAM:  VITAL SIGNS: ED Triage Vitals  Enc Vitals Group      BP 11/15/17 0811 (!) 72/47     Pulse Rate 11/15/17 0811 (!) 127     Resp 11/15/17 0811 17     Temp --      Temp src --      SpO2 11/15/17 0811 (!) 86 %     Weight 11/15/17 0812 132 lb 4.4 oz (60 kg)     Height 11/15/17 0812 5' (1.524 m)     Head Circumference --      Peak Flow --      Pain Score 11/15/17 0812 0     Pain Loc --      Pain Edu? --      Excl. in [place]? --    Constitutional: Patient is awake and alert, she is oriented to person but disoriented to place and time. Eyes: Normal exam ENT   Head: Normocephalic and atraumatic.   Mouth/Throat: Mucous membranes are moist. Cardiovascular: The rhythm rate around 120 bpm.  No obvious murmur rub or gallop. Respiratory: Normal respiratory effort without tachypnea  nor retractions. Breath sounds are clear and equal bilaterally. No wheezes/rales/rhonchi. Gastrointestinal: Soft, largely nontender abdomen.  Ostomy is present with a good amount of output.  No distention. Musculoskeletal: Nontender with normal range of motion in all extremities.  Neurologic: Normal speech and language.  Patient states left-sided deficits are chronic from prior CVA. Skin:  Skin is warm, dry and intact.  Psychiatric: Mood and affect are normal.  Confused at times.  ____________________________________________    EKG  EKG reviewed and interpreted by myself shows sinus tachycardia 151 bpm with a narrow QRS, normal axis, normal intervals besides QTC prolongation.  Nonspecific ST changes without elevation.  KG #2 reviewed and interpreted by myself appears show atrial fibrillation with rapid ventricular response of 162 bpm, narrow QRS, normal axis, continued QTC prolongation nonspecific ST changes  ____________________________________________    RADIOLOGY  Ultrasound shows fatty liver otherwise negative Chest x-ray shows left lower lobe atelectasis versus infiltrate  ____________________________________________   INITIAL IMPRESSION / ASSESSMENT  AND PLAN / ED COURSE  Pertinent labs & imaging results that were available during my care of the patient were reviewed by me and considered in my medical decision making (see chart for details).  Patient presents to the emergency department for confusion/altered mental status found to be hypotensive tachycardic and hypoxic.  Patient states lower abdominal pain but cannot quantify exactly how long it has been going on for.  Abdomen is benign with an ostomy present.  Concern for sepsis, will check labs, cultures, chest x-ray, start broad-spectrum antibiotics and continue to closely monitor.  Patient found to be febrile to 100.8, tachycardic, lactic acid is 2.2 white blood cell count of 16,000 consistent with significant sepsis.  Patient's lab work also shows a creatinine of 3.85, and reviewing the patient's records this is new for the patient, acute renal failure.  We will obtain a chest x-ray, urinalysis is pending.  Patient receiving broad-spectrum antibiotics.  Patient will require admission to the hospitalist service for further treatment once results are known.  Patient's labs are resulted showing significant urinary tract infection.  Patient is on appropriate antibiotics.  Patient's labs also so significant LFT elevation with a total bilirubin of 4.4.  We will obtain a right upper quadrant ultrasound to help rule out infection/obstruction.  X-ray shows infiltrate versus atelectasis.  Urinalysis consistent with urinary tract infection.  Patient remains somewhat hypotensive 90s over 60s, heart rate continues to flip from sinus tachycardia around 110 bpm to A. fib with RVR around 150.  Currently she is sinus tachycardia at 115 bpm.  Patient will be admitted to hospital service for further treatment.   CRITICAL CARE Performed by: Harvest Dark   Total critical care time: 45  minutes  Critical care time was exclusive of separately billable procedures and treating other patients.  Critical  care was necessary to treat or prevent imminent or life-threatening deterioration.  Critical care was time spent personally by me on the following activities: development of treatment plan with patient and/or surrogate as well as nursing, discussions with consultants, evaluation of patient's response to treatment, examination of patient, obtaining history from patient or surrogate, ordering and performing treatments and interventions, ordering and review of laboratory studies, ordering and review of radiographic studies, pulse oximetry and re-evaluation of patient's condition.   ____________________________________________   FINAL CLINICAL IMPRESSION(S) / ED DIAGNOSES  Sepsis Altered mental status Confusion Pneumonia Urinary tract infection   Harvest Dark, MD 11/15/17 1236

## 2017-11-15 NOTE — ED Notes (Signed)
EDP aware that pts HR remains unchanged after pain medication

## 2017-11-15 NOTE — Progress Notes (Signed)
CODE SEPSIS - PHARMACY COMMUNICATION  **Broad Spectrum Antibiotics should be administered within 1 hour of Sepsis diagnosis**  Time Code Sepsis Called/Page Received: 0826  Antibiotics Ordered: cefepime, vanc, metronidazole  Time of 1st antibiotic administration: 0835  Additional action taken by pharmacy:   If necessary, Name of Provider/Nurse Contacted:      Ramond Dial ,PharmD Clinical Pharmacist  11/15/2017  8:34 AM

## 2017-11-15 NOTE — ED Notes (Signed)
Pt remains tachycardic, Lungs clear bilaterally, skin is warm and dry, color is WNL, pt now hypotensive at 89/55, MAP 67

## 2017-11-15 NOTE — Consult Note (Signed)
Pharmacy Antibiotic Note  Theresa Brock is a 62 y.o. female admitted on 11/15/2017 with sepsis.  Pharmacy has been consulted for vancomycin and cefepime dosing. PMH includes colon cancer with metastasis to bone (recent radiation), GERD, hyperlipidemia, hypertension, stroke with left-sided hemiparesis, dysphagia, colon resection surgery more than a year ago due to colon cancer and status post ileostomy. She has no recent admissions [facility] at [facility].    Plan: 1) Vancomycin 750mg  IV every 48 hours beginning 30 hours after first 10000mg  dose in the ED.  Her Scr is highly elevated above her baseline level of less than 1 (very recently measured) which I suspect will improve quickly with hydration and the dose will require adjustment. For this reason I have not ordered a vancomycin trough.  K 0.016  T1/2: 43h  Vd 42L  Goal trough 15-20 mcg/mL.  Calculated concentrations at steady-state: 30.7/14.6 mcg/mL  2) cefepime 2 grams IV every 24 hours  Height: 5' (152.4 cm) Weight: 132 lb 4.4 oz (60 kg) IBW/kg (Calculated) : 45.5  Temp (24hrs), Avg:100.5 F (38.1 C), Min:98.2 F (36.8 C), Max:101.1 F (38.4 C)  Recent Labs  Lab 11/09/17 1527 11/15/17 0818 11/15/17 1033  WBC  --  16.2*  --   CREATININE 0.88 3.85*  --   LATICACIDVEN  --  2.2* 1.2    Estimated Creatinine Clearance: 12 mL/min (A) (by C-G formula based on SCr of 3.85 mg/dL (H)).    No Known Allergies  Antimicrobials this admission: vancomycin 10/13 >>  cefepime 10/13 >>   Microbiology results: 10/13 BCx: pending 10/13 UCx: pending   Thank you for allowing pharmacy to be a part of this patient's care.  Dallie Piles, PharmD 11/15/2017 4:00 PM

## 2017-11-15 NOTE — ED Notes (Signed)
552ml fluid bolus started on pt per admitting doctor then will take it down to 150ml.hour

## 2017-11-15 NOTE — ED Notes (Signed)
Admitting MD in room.

## 2017-11-15 NOTE — ED Triage Notes (Signed)
Pt arrives from home (RV) via [facility] with decreased LOC. EMS called Code sepsis in the field. Hx of CVA right side, cancer currently, and ostomy bag. 4l [place] on truck. BP 81/49 and HR 130 per EMS.

## 2017-11-15 NOTE — H&P (Signed)
[facility] [place] NAME: Theresa Brock    MR#:  [id number]  DATE OF BIRTH:  [birth year]  DATE OF ADMISSION:  11/15/2017  PRIMARY CARE PHYSICIAN: Trinna Post, PA-C   REQUESTING/REFERRING PHYSICIAN: [email or web address]  CHIEF COMPLAINT:   Chief Complaint  Patient presents with  . Code Sepsis    HISTORY OF PRESENT ILLNESS: Theresa Brock  is a 62 y.o. 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  Was able to walk with a cane until a month ago.  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  Currently bedbound or wheelchair-bound for the last few weeks.  Noted to have spread of cancer to her bones on recent nuclear scan.  Started on radiation therapy and also on pain management with increased dose of oral morphine recently. Since yesterday she is more drowsy and sleepy at home and not eating much, so husband brought her to the emergency room today. She is noted to be having acute renal failure, hypotension, fever, leukocytosis, elevated bilirubin and liver function enzymes, lactic acidosis, tachycardia. ER physician give broad-spectrum antibiotic and IV fluids and found to have UTI.  Given to hospitalist team for further management.  PAST MEDICAL HISTORY:   Past Medical History:  Diagnosis Date  . Colon cancer ([place])   . Diplopia 07/05/2014   Overview:  Converted from [employer]: Description - DIPLOPIA  . Dysphagia, oropharyngeal phase 09/20/2013   Overview:  Converted from [employer]: Description - DYSPHAGIA, OROPHARYNGEAL PHASE  . Essential (primary) hypertension 09/20/2013   Overview:  Converted from [employer]:  Description - HYPERTENSION, BENIGN ESSENTIAL  . GERD (gastroesophageal reflux disease)   . Hyperlipidemia   . Hypertension   . Malignant neoplasm of colon ([place]) 01/23/2017  . Stroke ([place])   . Tinea unguium 09/20/2013   Overview:  Converted from [employer]: Description - ONYCHOMYCOSIS, TOENAILS    PAST SURGICAL HISTORY:  Past Surgical History:  Procedure Laterality Date  . COLOSTOMY    . FLEXIBLE SIGMOIDOSCOPY N/A 05/14/2017   Procedure: FLEXIBLE SIGMOIDOSCOPY;  Surgeon: Lin Landsman, MD;  Location: [facility] ENDOSCOPY;  Service: Gastroenterology;  Laterality: N/A;  . PORT A CATH INJECTION ([place] [facility]) Right    7/18    SOCIAL HISTORY:  Social History   Tobacco Use  . Smoking status: Never Smoker  . Smokeless tobacco: Never Used  Substance Use Topics  . Alcohol use: No    FAMILY HISTORY:  Family History  Problem Relation Age of Onset  . Throat cancer Brother   . Stroke Mother   . Lung cancer Father     DRUG ALLERGIES: No Known Allergies  REVIEW OF SYSTEMS:   CONSTITUTIONAL: No fever, have fatigue or weakness.  EYES: No blurred or double vision.  EARS, NOSE, AND THROAT: No tinnitus or ear pain.  RESPIRATORY: No cough, shortness of breath, wheezing or hemoptysis.  CARDIOVASCULAR: No chest pain, orthopnea, edema.  GASTROINTESTINAL: No nausea, vomiting, diarrhea or abdominal pain.  GENITOURINARY: No dysuria, hematuria.  ENDOCRINE: No polyuria, nocturia,  HEMATOLOGY: No anemia, easy bruising or bleeding SKIN: No rash or lesion. MUSCULOSKELETAL: No joint pain or arthritis.   NEUROLOGIC: No tingling, numbness, weakness.  PSYCHIATRY: No anxiety or depression.  MEDICATIONS AT HOME:  Prior to Admission medications   Medication Sig Start Date End Date Taking? Authorizing Provider  atorvastatin (LIPITOR) 20 MG tablet Take 1 tablet (20 mg total) by mouth daily at 6 PM. 06/09/17 12/06/17 Yes Pollak, Adriana M, PA-C  dexamethasone (DECADRON) 4 MG tablet Take 2 tablets (8 mg  total) by mouth daily. With food 11/06/17  Yes Sindy Guadeloupe, MD  docusate sodium (COLACE) 250 MG capsule Take 250 mg by mouth daily.   Yes [provider]  losartan (COZAAR) 25 MG tablet Take 1 tablet (25 mg total) by mouth daily. 06/09/17 12/06/17 Yes Pollak, Wendee Beavers, PA-C  morphine (MS CONTIN) 15 MG 12 hr tablet Take 1 tablet (15 mg total) by mouth every 12 (twelve) hours for 7 days. 11/09/17 11/16/17 Yes Verlon Au, NP  morphine (MSIR) 30 MG tablet Take 1 tablet (30 mg total) by mouth every 4 (four) hours as needed for severe pain. Patient taking differently: Take 30 mg by mouth every 4 (four) hours as needed for severe pain. Taking every 6 hours 11/12/17  Yes Sindy Guadeloupe, MD  Multiple Vitamin (MULTIVITAMIN WITH MINERALS) TABS tablet Take 1 tablet by mouth daily.   Yes [provider]  acetaminophen (TYLENOL) 325 MG tablet Take 650 mg by mouth every 6 (six) hours as needed.    [provider]  fentaNYL (DURAGESIC - DOSED MCG/HR) 12 MCG/HR Place 1 patch (12.5 mcg total) onto the skin every 3 (three) days. Patient not taking: Reported on 11/12/2017 11/05/17   Sindy Guadeloupe, MD  loperamide (IMODIUM A-D) 2 MG tablet Take 1 tablet (2 mg total) by mouth 3 (three) times daily as needed. Take 2 at onset , then 1 every 2hr with each BM. Max dose 16 mg Patient not taking: Reported on 10/28/2017 10/26/17   Sindy Guadeloupe, MD  LORazepam (ATIVAN) 1 MG tablet Take 1 tablet (1 mg total) by mouth every 6 (six) hours as needed (NAUSEA). Patient not taking: Reported on 10/28/2017 10/26/17   Sindy Guadeloupe, MD  OLANZapine (ZYPREXA) 5 MG tablet Take 1 tablet (5 mg total) by mouth at bedtime. 11/12/17   Sindy Guadeloupe, MD  ondansetron (ZOFRAN) 8 MG tablet Take 1 tablet (8 mg total) by mouth 2 (two) times daily as needed for refractory nausea / vomiting. Start on day 3 after chemotherapy. Patient not taking: Reported on 10/28/2017 10/26/17   Sindy Guadeloupe, MD  prochlorperazine (COMPAZINE)  10 MG tablet Take 1 tablet (10 mg total) by mouth every 6 (six) hours as needed (NAUSEA). Patient not taking: Reported on 10/28/2017 10/26/17   Sindy Guadeloupe, MD      PHYSICAL EXAMINATION:   VITAL SIGNS: Blood pressure 93/64, pulse (!) 113, temperature (!) 100.6 F (38.1 C), resp. rate (!) 21, height 5' (1.524 m), weight 60 kg, SpO2 100 %.  GENERAL:  62 y.o.-year-old patient lying in the bed with no acute distress.  Sick appearing. EYES: Pupils equal, round, reactive to light and accommodation.  Positive for scleral icterus. Extraocular muscles intact.  HEENT: Head atraumatic, normocephalic. Oropharynx and nasopharynx clear.  NECK:  Supple, no jugular venous distention. No thyroid enlargement, no tenderness.  LUNGS: Normal breath sounds bilaterally, no wheezing, rales,rhonchi or crepitation. No use of accessory muscles of respiration.  CARDIOVASCULAR: S1, S2 fast. No murmurs, rubs, or gallops.  ABDOMEN: Soft, nontender, nondistended. Bowel sounds present. No organomegaly or mass.  Ileostomy bag with greenish liquidy discharge present. EXTREMITIES: No pedal edema,  cyanosis, or clubbing.  Atrophic changes on left side upper and lower extremity. NEUROLOGIC: Cranial nerves II through XII are intact. Muscle strength 4/5 in right side extremities 0/5 in the left side upper and lower extremities. Sensation intact. Gait not checked.  PSYCHIATRIC: The patient is alert and oriented x 3.  SKIN: No obvious rash, lesion, or ulcer.   LABORATORY PANEL:   CBC Recent Labs  Lab 11/15/17 0818  WBC 16.2*  HGB 10.4*  HCT 32.5*  PLT 238  MCV 95.6  MCH 30.6  MCHC 32.0  RDW 14.1  LYMPHSABS 1.0  MONOABS 0.9  EOSABS 0.4  BASOSABS 0.1   ------------------------------------------------------------------------------------------------------------------  Chemistries  Recent Labs  Lab 11/09/17 1527 11/15/17 0818  NA 134* 138  K 3.9 4.3  CL 96* 98  CO2 23 23  GLUCOSE 139* 118*  BUN 11 25*   CREATININE 0.88 3.85*  CALCIUM 10.4* 7.3*  AST  --  239*  ALT  --  69*  ALKPHOS  --  469*  BILITOT  --  4.4*   ------------------------------------------------------------------------------------------------------------------ estimated creatinine clearance is 12 mL/min (A) (by C-G formula based on SCr of 3.85 mg/dL (H)). ------------------------------------------------------------------------------------------------------------------ No results for input(s): TSH, T4TOTAL, T3FREE, THYROIDAB in the last 72 hours.  Invalid input(s): FREET3   Coagulation profile Recent Labs  Lab 11/15/17 0818  INR 1.36   ------------------------------------------------------------------------------------------------------------------- No results for input(s): DDIMER in the last 72 hours. -------------------------------------------------------------------------------------------------------------------  Cardiac Enzymes No results for input(s): CKMB, TROPONINI, MYOGLOBIN in the last 168 hours.  Invalid input(s): CK ------------------------------------------------------------------------------------------------------------------ Invalid input(s): POCBNP  ---------------------------------------------------------------------------------------------------------------  Urinalysis    Component Value Date/Time   COLORURINE YELLOW (A) 11/15/2017 0811   APPEARANCEUR TURBID (A) 11/15/2017 0811   LABSPEC 1.011 11/15/2017 0811   PHURINE 7.0 11/15/2017 0811   GLUCOSEU NEGATIVE 11/15/2017 0811   HGBUR SMALL (A) 11/15/2017 0811   BILIRUBINUR NEGATIVE 11/15/2017 0811   KETONESUR 5 (A) 11/15/2017 0811   PROTEINUR 100 (A) 11/15/2017 0811   NITRITE NEGATIVE 11/15/2017 0811   LEUKOCYTESUR MODERATE (A) 11/15/2017 0811     RADIOLOGY: Dg Chest Port 1 View  Result Date: 11/15/2017 CLINICAL DATA:  Sepsis, altered mental status EXAM: PORTABLE CHEST 1 VIEW COMPARISON:  CT chest 10/02/2017 FINDINGS: Cardiac  enlargement. Bilateral mild airspace disease is symmetric and could represent edema. Bibasilar atelectasis. Small left effusion. IMPRESSION: Mild bilateral airspace disease possibly edema. Left lower lobe atelectasis/infiltrate and small left effusion. Electronically Signed   By: Franchot Gallo M.D.   On: 11/15/2017 09:22   US Abdomen Limited Ruq  Result Date: 11/15/2017 CLINICAL DATA:  Sepsis and elevated LFTs EXAM: ULTRASOUND ABDOMEN LIMITED RIGHT UPPER QUADRANT COMPARISON:  None. FINDINGS: Gallbladder: No gallstones or wall thickening visualized. No sonographic Murphy sign noted by sonographer. Common bile duct: Diameter: 3.8 mm Liver: Diffuse increased echogenicity is noted consistent with fatty infiltration. No focal mass is noted. Portal vein is patent on color Doppler imaging with normal direction of blood flow towards the liver. IMPRESSION: Fatty liver Electronically Signed   By: Inez Catalina M.D.   On: 11/15/2017 12:17    EKG: Orders placed or performed during the hospital encounter of 11/15/17  . ED EKG 12-Lead  . ED EKG 12-Lead  . ED EKG  . ED EKG    IMPRESSION AND PLAN:  *Severe sepsis Presented with fever, tachycardia, hypotension, elevated white blood cell count. IV fluid boluses are given, continue IV fluid supplementation. Blood cultures are sent.  Urine culture is sent. With broad-spectrum IV antibiotic for  now.  *Acute renal failure Secondary to dehydration and decreased oral intake and sepsis IV fluid boluses and monitor. Avoid antihypertensive and nephrotoxic medications for now.  *Elevated liver enzymes. Elevated bilirubin. This could be secondary to sepsis. Ultrasound shows fatty liver but no obstruction. Recent scan showed metastasis to bone but not in the liver. Continue monitor liver enzymes with the treatment.  *Hyperlipidemia Hold atorvastatin for now due to worsening liver function.  *Hypertension Hold losartan now, due to sepsis.  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 She is on high dose of oral morphine, I will decrease the dose of short-acting MS Contin and stop the long-acting morphine for now as blood pressure is running borderline.  We may need to resume once she is more stable.  *Metastatic colon cancer Status post colectomy and ileostomy Status post chemotherapy last year and radiation therapy to the bones last week. Continue management per oncology team.  *History of stroke with left-sided hemiplegia, recent decline due to metastatic cancer and functional quadriplegia now Patient is wheelchair-bound and husband helps at home.  All the records are reviewed and case discussed with ED provider. Management plans discussed with the patient, family and they are in agreement.  CODE STATUS: DNR   TOTAL TIME TAKING CARE OF THIS PATIENT: 50 minutes.  Discussed with her husband in the room.  Vaughan Basta M.D on 11/15/2017   Between 7am to 6pm - Pager - [phone]  After 6pm go to [email or web address] - password [email or web address] [facility]  Office  [phone]  CC: Primary care physician; Trinna Post, PA-C   Note: This dictation was prepared with Dragon dictation along with smaller phrase technology. Any transcriptional errors that result from this process are unintentional.

## 2017-11-15 NOTE — Progress Notes (Signed)
Family Meeting Note  Advance Directive:yes  Today a meeting took place with the spouse.  The following clinical team members were present during this meeting:MD  The following were discussed:Patient's diagnosis: Metastatic colon cancer, sepsis, elevated liver enzymes, acute renal failure and dehydration, functional decline, Patient's progosis: < 12 months and Goals for treatment: DNR  Additional follow-up to be provided: PMD  Time spent during discussion:20 minutes  Vaughan Basta, MD

## 2017-11-15 NOTE — Progress Notes (Signed)
Patient blood pressure is 80/50.  Heart rate around 100. She is alert and oriented without any acute distress. I have seen the patient again.  Her husband is still in the room.  I again discussed with him about the treatment options. She had already received 3-1/2 L of fluids since she came to emergency room and does not have any urine output so far in her Foley catheter. I had informed her husband that this could be the sign of severe septic shock. She already received broad-spectrum antibiotics, with her renal failure-she may not need to have another dose until more than a day. Currently mainly the options would be to continue the IV fluids to support dehydration and renal function and hypotension.  Or start her on more aggressive measures like transferring to ICU with vasopressors drips. He had confirmed that patient had made it clear, she would not want any aggressive measures and would like to be DNR. We had discussed the plan in detail again and confirmed that for now we will continue the IV fluids and if she does not respond with her blood pressure, he would like her to go on comfort measures and hospice care. Currently I will like to hold her morphine as that may also affect her blood pressure but if we have to move her to comfort care then we can start on morphine as needed for her metastatic pain. With this plan in place, we will remove her to medical floor as there is no need to send her to ICU. Additional time spent 20 minutes in goals of care discussion.

## 2017-11-16 ENCOUNTER — Inpatient Hospital Stay: Payer: BLUE CROSS/BLUE SHIELD

## 2017-11-16 ENCOUNTER — Ambulatory Visit: Payer: BLUE CROSS/BLUE SHIELD

## 2017-11-16 DIAGNOSIS — C7951 Secondary malignant neoplasm of bone: Secondary | ICD-10-CM

## 2017-11-16 DIAGNOSIS — I10 Essential (primary) hypertension: Secondary | ICD-10-CM

## 2017-11-16 DIAGNOSIS — N390 Urinary tract infection, site not specified: Secondary | ICD-10-CM

## 2017-11-16 DIAGNOSIS — N179 Acute kidney failure, unspecified: Secondary | ICD-10-CM

## 2017-11-16 DIAGNOSIS — C189 Malignant neoplasm of colon, unspecified: Secondary | ICD-10-CM

## 2017-11-16 LAB — URINALYSIS, ROUTINE W REFLEX MICROSCOPIC
Bilirubin Urine: NEGATIVE
Glucose, UA: 150 mg/dL — AB
KETONES UR: 5 mg/dL — AB
Nitrite: NEGATIVE
PROTEIN: 100 mg/dL — AB
SPECIFIC GRAVITY, URINE: 1.01 (ref 1.005–1.030)
SQUAMOUS EPITHELIAL / LPF: NONE SEEN (ref 0–5)
pH: 6 (ref 5.0–8.0)

## 2017-11-16 LAB — COMPREHENSIVE METABOLIC PANEL
ALT: 76 U/L — AB (ref 0–44)
ANION GAP: 16 — AB (ref 5–15)
AST: 236 U/L — AB (ref 15–41)
Albumin: 2.1 g/dL — ABNORMAL LOW (ref 3.5–5.0)
Alkaline Phosphatase: 460 U/L — ABNORMAL HIGH (ref 38–126)
BUN: 31 mg/dL — ABNORMAL HIGH (ref 8–23)
CHLORIDE: 106 mmol/L (ref 98–111)
CO2: 15 mmol/L — AB (ref 22–32)
Calcium: 5.8 mg/dL — CL (ref 8.9–10.3)
Creatinine, Ser: 4.67 mg/dL — ABNORMAL HIGH (ref 0.44–1.00)
GFR calc Af Amer: 10 mL/min — ABNORMAL LOW (ref >60)
GFR, EST NON AFRICAN AMERICAN: 9 mL/min — AB (ref >60)
GLUCOSE: 143 mg/dL — AB (ref 70–99)
POTASSIUM: 4.5 mmol/L (ref 3.5–5.1)
Sodium: 137 mmol/L (ref 135–145)
Total Bilirubin: 3.3 mg/dL — ABNORMAL HIGH (ref 0.3–1.2)
Total Protein: 6.1 g/dL — ABNORMAL LOW (ref 6.5–8.1)

## 2017-11-16 LAB — CBC
HCT: 30.3 % — ABNORMAL LOW (ref 36.0–46.0)
HEMOGLOBIN: 9.4 g/dL — AB (ref 12.0–15.0)
MCH: 30.5 pg (ref 26.0–34.0)
MCHC: 31 g/dL (ref 30.0–36.0)
MCV: 98.4 fL (ref 80.0–100.0)
NRBC: 0 % (ref 0.0–0.2)
Platelets: 189 10*3/uL (ref 150–400)
RBC: 3.08 MIL/uL — AB (ref 3.87–5.11)
RDW: 14.1 % (ref 11.5–15.5)
WBC: 15.2 10*3/uL — ABNORMAL HIGH (ref 4.0–10.5)

## 2017-11-16 MED ORDER — CALCIUM CARBONATE ANTACID 500 MG PO CHEW: 400.0000 mg | CHEWABLE_TABLET | Freq: Two times a day (BID) | ORAL | Status: DC

## 2017-11-16 MED ORDER — SODIUM CHLORIDE 0.9 % IV SOLN
1.0000 g | Freq: Once | INTRAVENOUS | Status: AC
  Administered 2017-11-16: 1 g via INTRAVENOUS
  Filled 2017-11-16: qty 10

## 2017-11-16 MED ORDER — ONDANSETRON HCL 4 MG/2ML IJ SOLN
4.0000 mg | INTRAMUSCULAR | Status: DC | PRN
  Administered 2017-11-16: 4 mg via INTRAVENOUS
  Filled 2017-11-16: qty 2

## 2017-11-16 MED ORDER — VANCOMYCIN HCL IN DEXTROSE 750-5 MG/150ML-% IV SOLN
750.0000 mg | INTRAVENOUS | Status: DC
  Filled 2017-11-16: qty 150

## 2017-11-16 NOTE — Progress Notes (Signed)
Family Meeting Note  Advance Directive:yes  Today a meeting took place with the Patient, husband, daughter.  Patient is unable to participate due LD:[id number] capacity Lethargic   The following clinical team members were present during this meeting:MD  The following were discussed:Patient's diagnosis: , Patient's progosis: Unable to determine and Goals for treatment: DNI  Additional follow-up to be provided: prn  Time spent during discussion:20 minutes  Gorden Harms, MD

## 2017-11-16 NOTE — Progress Notes (Addendum)
Please note patient has a PENDING outpatient PALLIATIVE referral, she was hospitalized prior to being seen. CMRN Isaias Cowman made aware. Flo Shanks RN, BSN, [facility] [place], hospital Liaison [phone]

## 2017-11-16 NOTE — Consult Note (Addendum)
Hematology/Oncology Consult note [facility] Telephone:([phone] [phone]  Patient Care Team: Paulene Floor as PCP - General (Physician Assistant) Sindy Guadeloupe, MD as Medical Oncologist (Medical Oncology)   Name of the patient: Theresa Brock  [id number]  1955/05/18    Reason for consult: h/o colon cancer with bone mets   Requestign physician: Dr. 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  She was also diagnosed with stage III colon cancer in May 2018 but did not complete her adjuvant chemotherapy because of complications.  She was seeing me as an outpatient for surveillance of her colon cancer and was recently found to have a large lytic lesion involving sacrum causing tailbone pain as well as problems with ambulation.  Subsequent bone scan also showed evidence of metastases in her bilateral femur and shoulder.  Patient at baseline has left-sided hemiparesis due to prior stroke but was able to lift up herself from the wheelchair and ambulate to the bathroom.  She has been getting progressively weaker over the last couple of weeks and has been a total assist even for her ADLs.  She has been seeing [place] as an outpatient at the cancer center once or twice a week to manage her pain.  She has been started on long-acting as well as short-acting morphine to control her pain.  She has not yet been started on any systemic chemotherapy but did complete palliative radiation to her sacrum as well as to the right femur.  Plan was to see if she could improve her performance status to the point that she could subsequently received chemotherapy.  Patient was brought to the hospital yesterday with symptoms of lethargy and she was found to be hypotensive with blood pressure in her 70s to  80s.  She was also found to have acute kidney injury.  Her baseline creatinine is between 0.7-0.8 and was normal until about a week ago and was elevated to 3 on admission and has further worsened to 4.  Nephrology is on for.  There is no evidence of hydronephrosis and plan is to continue IV fluids to see how much of her kidney functions recover.  Patient was also found to be icteric and have abnormal liver functions with elevated bilirubin of 4 which has come down to 3 as well as elevated AST and ALT.  GI is on board for her abnormal LFTs.  Patient is more alert today and oriented to self and place.  She denies any pain at rest.  She does report fatigue  ECOG PS- 3  Pain scale- 4   Review of systems- Review of Systems  Constitutional: Positive for malaise/fatigue and weight loss. Negative for chills and fever.  HENT: Negative for congestion, ear discharge and nosebleeds.   Eyes: Negative for blurred vision.  Respiratory: Negative for cough, hemoptysis, sputum production, shortness of breath and wheezing.   Cardiovascular: Negative for chest pain, palpitations, orthopnea and claudication.  Gastrointestinal: Negative for abdominal pain, blood in stool, constipation, diarrhea, heartburn, melena, nausea and vomiting.       Lack of appetite  Genitourinary: Negative for dysuria, flank pain, frequency, hematuria and urgency.  Musculoskeletal: Negative for back pain, joint pain and myalgias.  Skin: Negative for rash.  Neurological: Negative for dizziness, tingling, focal weakness, seizures, weakness and headaches.  Endo/Heme/Allergies: Does not bruise/bleed  easily.  Psychiatric/Behavioral: Negative for depression and suicidal ideas. The patient does not have insomnia.     No Known Allergies  Patient Active Problem List   Diagnosis Date Noted  . Severe sepsis ([place]) 11/15/2017  . Acute lower UTI 11/15/2017  . Acute renal failure (ARF) ([place]) 11/15/2017  . Elevated LFTs 11/15/2017  . Altered  mobility due to old stroke 11/12/2017  . Goals of care, counseling/discussion 11/06/2017  . Cancer related pain 11/02/2017  . Bone metastases ([place]) 10/26/2017  . 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 Malignant neoplasm of colon ([place]) 01/23/2017  . Encounter for follow-up surveillance of colon cancer 01/23/2017  . Adenocarcinoma ([place]) 06/15/2016  . 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 Abnormal CT scan, gastrointestinal tract 05/26/2016  . Diplopia 07/05/2014  . Constipation 09/20/2013  . Dysphagia, oropharyngeal phase 09/20/2013  . Encounter for general adult medical examination without abnormal findings 09/20/2013  . Essential (primary) hypertension 09/20/2013  . Hemiplegia and hemiparesis following unspecified cerebrovascular disease affecting unspecified side ([place]) 09/20/2013  . Hyperlipidemia 09/20/2013  . Other disorders of intestinal carbohydrate absorption 09/20/2013  . Tinea unguium 09/20/2013     Past Medical History:  Diagnosis Date  . Colon cancer ([place])   . Diplopia 07/05/2014   Overview:  Converted from Centricity: Description - DIPLOPIA  . Dysphagia, oropharyngeal phase 09/20/2013   Overview:  Converted from Centricity: Description - DYSPHAGIA, OROPHARYNGEAL PHASE  . Essential (primary) hypertension 09/20/2013   Overview:  Converted from Centricity: Description - HYPERTENSION, BENIGN ESSENTIAL  . GERD (gastroesophageal reflux disease)   . Hyperlipidemia   . Hypertension   . Malignant neoplasm of colon ([place]) 01/23/2017  . Stroke ([place])   . Tinea unguium 09/20/2013   Overview:  Converted from Centricity: Description - ONYCHOMYCOSIS, TOENAILS     Past Surgical History:  Procedure Laterality Date  . COLOSTOMY    . FLEXIBLE SIGMOIDOSCOPY N/A 05/14/2017   Procedure: FLEXIBLE SIGMOIDOSCOPY;  Surgeon: Lin Landsman, MD;  Location: [facility] ENDOSCOPY;  Service: Gastroenterology;  Laterality: N/A;  . PORT A CATH INJECTION ([place] HX) Right    7/18     Social History   Socioeconomic History  . Marital status: Married    Spouse name: Not on file  . Number of children: Not on file  . Years of education: Not on file  . Highest education level: Not on file  Occupational History  . Not on file  Social Needs  . Financial resource strain: Not on file  . Food insecurity:    Worry: Not on file    Inability: Not on file  . Transportation needs:    Medical: Not on file    Non-medical: Not on file  Tobacco Use  . Smoking status: Never Smoker  . Smokeless tobacco: Never Used  Substance and Sexual Activity  . Alcohol use: No  . Drug use: No  . Sexual activity: Not on file  Lifestyle  . Physical activity:    Days per week: Not on file    Minutes per session: Not on file  . Stress: Not on file  Relationships  . Social connections:    Talks on phone: Not on file    Gets together: Not on file    Attends religious service: Not on file    Active member of club or organization: Not on file    Attends meetings of clubs or organizations: Not on file    Relationship status: Not on file  . Intimate partner  violence:    Fear of current or ex partner: Not on file    Emotionally abused: Not on file    Physically abused: Not on file    Forced sexual activity: Not on file  Other Topics Concern  . Not on file  Social History Narrative  . Not on file     Family History  Problem Relation Age of Onset  . Throat cancer Brother   . Stroke Mother   . Lung cancer Father      Current Facility-Administered Medications:  .  0.9 %  sodium chloride infusion, , Intravenous, Continuous, Salary, Montell D, MD, Last Rate: 150 mL/hr at 11/16/17 1410 .  acetaminophen (TYLENOL) tablet 650 mg, 650 mg, Oral, Q6H PRN, Vaughan Basta, MD .  atorvastatin (LIPITOR) tablet 20 mg, 20 mg, Oral, q1800, Vaughan Basta, MD, 20 mg at 11/15/17 1744 .  [START ON 11/17/2017] calcium carbonate (TUMS - dosed in mg elemental calcium) chewable tablet  400 mg of elemental calcium, 400 mg of elemental calcium, Oral, BID WC, Salary, Montell D, MD .  ceFEPIme (MAXIPIME) 2 g in sodium chloride 0.9 % 100 mL IVPB, 2 g, Intravenous, Q24H, Vaughan Basta, MD, Stopped at 11/16/17 0841 .  dexamethasone (DECADRON) tablet 8 mg, 8 mg, Oral, Daily, Vaughan Basta, MD, 8 mg at 11/16/17 1122 .  docusate sodium (COLACE) capsule 100 mg, 100 mg, Oral, BID PRN, Vaughan Basta, MD .  heparin injection 5,000 Units, 5,000 Units, Subcutaneous, Q8H, Vaughan Basta, MD, 5,000 Units at 11/16/17 1406 .  MEDLINE mouth rinse, 15 mL, Mouth Rinse, BID, Vaughan Basta, MD, 15 mL at 11/16/17 1122 .  morphine (MSIR) tablet 15 mg, 15 mg, Oral, Q4H PRN, Vaughan Basta, MD, 15 mg at 11/16/17 1407 .  multivitamin with minerals tablet 1 tablet, 1 tablet, Oral, Daily, Vaughan Basta, MD, 1 tablet at 11/16/17 1121 .  [START ON 11/17/2017] vancomycin (VANCOCIN) IVPB 750 mg/150 ml premix, 750 mg, Intravenous, Q48H, Lu Duffel, [facility]   Physical exam:  Vitals:   11/15/17 2006 11/16/17 0449 11/16/17 1138 11/16/17 1305  BP: (!) 83/47 92/62  (!) 96/59  Pulse: (!) 105 93 96 (!) 101  Resp: (!) 22 20  16   Temp: 98.3 F (36.8 C) 98 F (36.7 C)  98.5 F (36.9 C)  TempSrc: Oral Oral  Axillary  SpO2: 100% 98% 99% 100%  Weight:      Height:       Physical Exam  Constitutional: She is oriented to person, place, and time.  Patient is thin and fatigued.  HENT:  Head: Normocephalic and atraumatic.  Eyes: Pupils are equal, round, and reactive to light. EOM are normal.  Neck: Normal range of motion.  Cardiovascular: Normal rate, regular rhythm and normal heart sounds.  Pulmonary/Chest: Effort normal and breath sounds normal.  Abdominal: Soft. Bowel sounds are normal.  Colostomy in place  Neurological: She is alert and oriented to person, place, and time.  Left-sided hemiparesis  Skin: Skin is warm and dry.       CMP  Latest Ref Rng & Units 11/16/2017  Glucose 70 - 99 mg/dL 143(H)  BUN 8 - 23 mg/dL 31(H)  Creatinine 0.44 - 1.00 mg/dL 4.67(H)  Sodium 135 - 145 mmol/L 137  Potassium 3.5 - 5.1 mmol/L 4.5  Chloride 98 - 111 mmol/L 106  CO2 22 - 32 mmol/L 15(L)  Calcium 8.9 - 10.3 mg/dL 5.8(LL)  Total Protein 6.5 - 8.1 g/dL 6.1(L)  Total Bilirubin 0.3 - 1.2 mg/dL 3.3(H)  Alkaline Phos 38 - 126 U/L 460(H)  AST 15 - 41 U/L 236(H)  ALT 0 - 44 U/L 76(H)   CBC Latest Ref Rng & Units 11/16/2017  WBC 4.0 - 10.5 K/uL 15.2(H)  Hemoglobin 12.0 - 15.0 g/dL 9.4(L)  Hematocrit 36.0 - 46.0 % 30.3(L)  Platelets 150 - 400 K/uL 189    @IMAGES @  Mr Jeri Cos Wo Contrast  Result Date: 11/07/2017 CLINICAL DATA:  62 y/o F; history of right-sided stroke in 2015 as well as metastatic colon cancer. Sudden onset right-sided weakness 5 days ago. EXAM: MRI HEAD WITHOUT AND WITH CONTRAST TECHNIQUE: Multiplanar, multiecho pulse sequences of the brain and surrounding structures were obtained without and with intravenous contrast. CONTRAST:  6.5 cc Gadavist COMPARISON:  None. FINDINGS: Brain: Chronic hemosiderin stained infarction of the right insula, lentiform nucleus, and corona radiata with wallerian degeneration extending into the right cerebral peduncle and brainstem. Additional small foci of susceptibility hypointensity are present within the left temporal and occipital lobes which may represent hemosiderin deposition of chronic microhemorrhage or tiny cavernoma. Mildly increased T2 and decreased T1 signal within the left caudate head, anterior lentiform nucleus, and insula (series 15, image 14 and 16) with intermediate diffusion on ADC (series 5, image 33 and series 6, image 33). After administration of intravenous contrast there is no abnormal enhancement of the brain. No hydrocephalus, extra-axial collection, mass effect, or herniation. Scattered nonspecific T2 FLAIR hyperintensities in subcortical and periventricular white matter  are compatible with moderate chronic microvascular ischemic changes for age. Moderate volume loss of the brain. Vascular: Normal flow voids. Skull and upper cervical spine: Normal marrow signal. Sinuses/Orbits: Negative. Other: None. IMPRESSION: 1. Increased signal within left caudate head, anterior lentiform nucleus, and insula with intermediate diffusion. Findings may represent subacute infarction. Differential includes hypoxic ischemic or toxic metabolic injury. 2. No intracranial metastasis identified. 3. Chronic infarct in the right insula and basal ganglia. Moderate chronic microvascular ischemic changes and volume loss of the brain. Electronically Signed   By: Kristine Garbe M.D.   On: 11/07/2017 00:48   Nm Bone Scan Whole Body  Result Date: 11/02/2017 CLINICAL DATA:  Colon cancer, bone metastases EXAM: NUCLEAR MEDICINE WHOLE BODY BONE SCAN TECHNIQUE: Whole body anterior and posterior images were obtained approximately 3 hours after intravenous injection of radiopharmaceutical. RADIOPHARMACEUTICALS:  23.273 mCi Technetium-80m MDP IV COMPARISON:  None Correlation: CT chest abdomen pelvis 10/02/2017 FINDINGS: Multiple sites of abnormal osseous tracer accumulation are identified consistent with osseous metastatic disease. These include calvarium, proximal and mid RIGHT humerus, distal sternum, midthoracic and upper lumbar spine, posterior RIGHT rib, multiple sites in pelvis bilaterally, and BILATERAL proximal femora greater on RIGHT. Somewhat pronounced renal cortical retention of tracer bilaterally suggesting renal dysfunction. Expected soft tissue distribution of tracer. IMPRESSION: Multiple osseous metastases as above, including BILATERAL femora and RIGHT humerus as discussed above. Electronically Signed   By: Lavonia Dana M.D.   On: 11/02/2017 17:28   US Renal  Result Date: 11/16/2017 CLINICAL DATA:  Acute renal failure, metastatic colonic malignancy to the skeleton. EXAM: RENAL / URINARY  TRACT ULTRASOUND COMPLETE COMPARISON:  Abdominal and pelvic CT scan of October 02, 2017 FINDINGS: Right Kidney: Length: 12.1 cm. The renal cortical echotexture is increased but remains lower than that of the adjacent liver. There is no hydronephrosis. No suspicious masses are observed. Left Kidney: Length: 12.0 cm. The renal cortical echotexture of the left kidney is similar to that on the right. There is no hydronephrosis. No suspicious masses are observed.  Bladder: The urinary bladder is decompressed by Foley catheter. IMPRESSION: Mildly increased renal cortical echotexture likely reflects medical renal disease. There is no hydronephrosis. No parenchymal masses are observed. The urinary bladder is decompressed by Foley catheter and cannot be adequately assessed. Electronically Signed   By: David  Martinique M.D.   On: 11/16/2017 12:46   Dg Chest Port 1 View  Result Date: 11/15/2017 CLINICAL DATA:  Sepsis, altered mental status EXAM: PORTABLE CHEST 1 VIEW COMPARISON:  CT chest 10/02/2017 FINDINGS: Cardiac enlargement. Bilateral mild airspace disease is symmetric and could represent edema. Bibasilar atelectasis. Small left effusion. IMPRESSION: Mild bilateral airspace disease possibly edema. Left lower lobe atelectasis/infiltrate and small left effusion. Electronically Signed   By: Franchot Gallo M.D.   On: 11/15/2017 09:22   US Abdomen Limited Ruq  Result Date: 11/15/2017 CLINICAL DATA:  Sepsis and elevated LFTs EXAM: ULTRASOUND ABDOMEN LIMITED RIGHT UPPER QUADRANT COMPARISON:  None. FINDINGS: Gallbladder: No gallstones or wall thickening visualized. No sonographic Murphy sign noted by sonographer. Common bile duct: Diameter: 3.8 mm Liver: Diffuse increased echogenicity is noted consistent with fatty infiltration. No focal mass is noted. Portal vein is patent on color Doppler imaging with normal direction of blood flow towards the liver. IMPRESSION: Fatty liver Electronically Signed   By: Inez Catalina M.D.    On: 11/15/2017 12:17    Assessment and plan- Patient is a 62 y.o. female with colon cancer now found to have bone metastases admitted for hypotension AKI as well as acute liver injury  1.  AKI possibly secondary to hypotension.  Her creatinine is significantly elevated from her baseline of 0.8-4 today.  She is currently getting IV fluids.  There is no evidence of obstruction.  Urinalysis shows possible evidence of UTI and she is being managed for sepsis.  GI is also on board for her acute liver injury.  2.  Patient's baseline performance status is poor and plan as an outpatient was to see if her pain could be better control to be able to consider systemic chemotherapy.  If her performance status did not improve plan was to proceed with best supportive care/hospice.  With her present hospitalization it remains to be seen how much of her kidney functions were to recover.  Her overall prognosis is poor and patient and her husband understand that.  She was supposed to be seen by outpatient home-based palliative care which has not yet started.  I will have our nurse practitioner Altha Harm see her tomorrow to discuss further goals of care.  3.  Leukocytosis likely secondary to sepsis.  Continue IV fluids and antibiotics and continue to monitor.  4. Caution using prn morphine in the setting of AKI as it can cause neurotoxicity. Prn dilaudid or fentanyl would be safer in this situation  5. Hypocalcemia: she did receive zometa on 11/09/17. Her corrected calcium after accounting for hypoalbuminemia is 7.8   We will continue to follow   Visit Diagnosis 1. Sepsis, due to unspecified organism, unspecified whether acute organ dysfunction present ([place])   2. Urinary tract infection without hematuria, site unspecified   3. Community acquired pneumonia, unspecified laterality   4. ARF (acute renal failure) ([place])     Dr. Randa Evens, MD, MPH [facility] at [facility] [phone] 11/16/2017 5:15 PM

## 2017-11-16 NOTE — Consult Note (Signed)
Lucilla Lame, MD [address] [place], [place] [address] Phone: [phone] Fax : [phone]  Consultation  Referring Provider:     Dr. Jerelyn Charles Primary Care Physician:  Trinna Post, PA-C Primary Gastroenterologist:  Dr. Marius Ditch         Reason for Consultation:     Transaminitis  Date of Admission:  11/15/2017 Date of Consultation:  11/16/2017         HPI:   Theresa Brock is a 62 y.o. female who has a history of metastatic colon cancer with bone metastasis.  The patient is followed by oncology and has had trouble with pain management.  The patient had a colon resection with an end ileostomy and was treated with 5 cycles of FOLFOX that ended in October 2018.  The patient was admitted now with sepsis.  The patient does have a history of a stroke with left-sided hemiparesis and dysphasia. The patient was admitted after feeling more drowsy and sleepy at home and was not eating much.  The patient was noted to have acute renal failure and has been evaluated by nephrology.  She was also noted to have a fever leukocytosis and hypotension.  Her lactic acid was elevated and she had tachycardia.  The patient was started on broad-spectrum antibiotics in the ER.  Her source of infection was believed to be from a urinary tract infection  Past Medical History:  Diagnosis Date  . Colon cancer ([place])   . Diplopia 07/05/2014   Overview:  Converted from [employer]: Description - DIPLOPIA  . Dysphagia, oropharyngeal phase 09/20/2013   Overview:  Converted from [employer]: Description - DYSPHAGIA, OROPHARYNGEAL PHASE  . Essential (primary) hypertension 09/20/2013   Overview:  Converted from [employer]: Description - HYPERTENSION, BENIGN ESSENTIAL  . GERD (gastroesophageal reflux disease)   . Hyperlipidemia   . Hypertension   . Malignant neoplasm of colon ([place]) 01/23/2017  . Stroke ([place])   . Tinea unguium 09/20/2013   Overview:  Converted from [facility]: Description - ONYCHOMYCOSIS,  TOENAILS    Past Surgical History:  Procedure Laterality Date  . COLOSTOMY    . FLEXIBLE SIGMOIDOSCOPY N/A 05/14/2017   Procedure: FLEXIBLE SIGMOIDOSCOPY;  Surgeon: Lin Landsman, MD;  Location[facility] ENDOSCOPY;  Service: Gastroenterology;  Laterality: N/A;  . PORT A CATH INJECTION ([place] [facility]) Right    7/18    Prior to Admission medications   Medication Sig Start Date End Date Taking? Authorizing Provider  atorvastatin (LIPITOR) 20 MG tablet Take 1 tablet (20 mg total) by mouth daily at 6 PM. 06/09/17 12/06/17 Yes Pollak, Adriana M, PA-C  dexamethasone (DECADRON) 4 MG tablet Take 2 tablets (8 mg total) by mouth daily. With food 11/06/17  Yes Sindy Guadeloupe, MD  docusate sodium (COLACE) 250 MG capsule Take 250 mg by mouth daily.   Yes [provider]  losartan (COZAAR) 25 MG tablet Take 1 tablet (25 mg total) by mouth daily. 06/09/17 12/06/17 Yes Pollak, Wendee Beavers, PA-C  morphine (MS CONTIN) 15 MG 12 hr tablet Take 1 tablet (15 mg total) by mouth every 12 (twelve) hours for 7 days. 11/09/17 11/16/17 Yes Verlon Au, NP  morphine (MSIR) 30 MG tablet Take 1 tablet (30 mg total) by mouth every 4 (four) hours as needed for severe pain. Patient taking differently: Take 30 mg by mouth every 4 (four) hours as needed for severe pain. Taking every 6 hours 11/12/17  Yes Sindy Guadeloupe, MD  Multiple Vitamin (MULTIVITAMIN WITH MINERALS)  TABS tablet Take 1 tablet by mouth daily.   Yes [provider]  acetaminophen (TYLENOL) 325 MG tablet Take 650 mg by mouth every 6 (six) hours as needed.    [provider]  fentaNYL (DURAGESIC - DOSED MCG/HR) 12 MCG/HR Place 1 patch (12.5 mcg total) onto the skin every 3 (three) days. Patient not taking: Reported on 11/12/2017 11/05/17   Sindy Guadeloupe, MD  loperamide (IMODIUM A-D) 2 MG tablet Take 1 tablet (2 mg total) by mouth 3 (three) times daily as needed. Take 2 at onset , then 1 every 2hr with each BM. Max dose 16 mg Patient not taking:  Reported on 10/28/2017 10/26/17   Sindy Guadeloupe, MD  LORazepam (ATIVAN) 1 MG tablet Take 1 tablet (1 mg total) by mouth every 6 (six) hours as needed (NAUSEA). Patient not taking: Reported on 10/28/2017 10/26/17   Sindy Guadeloupe, MD  OLANZapine (ZYPREXA) 5 MG tablet Take 1 tablet (5 mg total) by mouth at bedtime. 11/12/17   Sindy Guadeloupe, MD  ondansetron (ZOFRAN) 8 MG tablet Take 1 tablet (8 mg total) by mouth 2 (two) times daily as needed for refractory nausea / vomiting. Start on day 3 after chemotherapy. Patient not taking: Reported on 10/28/2017 10/26/17   Sindy Guadeloupe, MD  prochlorperazine (COMPAZINE) 10 MG tablet Take 1 tablet (10 mg total) by mouth every 6 (six) hours as needed (NAUSEA). Patient not taking: Reported on 10/28/2017 10/26/17   Sindy Guadeloupe, MD    Family History  Problem Relation Age of Onset  . Throat cancer Brother   . Stroke Mother   . Lung cancer Father      Social History   Tobacco Use  . Smoking status: Never Smoker  . Smokeless tobacco: Never Used  Substance Use Topics  . Alcohol use: No  . Drug use: No    Allergies as of 11/15/2017  . (No Known Allergies)    Review of Systems:    All systems reviewed and negative except where noted in HPI.   Physical Exam:  Vital signs in last 24 hours: Temp:  [98 F (36.7 C)-98.5 F (36.9 C)] 98.5 F (36.9 C) (10/14 1305) Pulse Rate:  [93-108] 101 (10/14 1305) Resp:  [16-22] 16 (10/14 1305) BP: (83-98)/(47-62) 96/59 (10/14 1305) SpO2:  [98 %-100 %] 100 % (10/14 1305) Last BM Date: 11/15/17 General:   Pleasant, cooperative in NAD Head:  Normocephalic and atraumatic. Eyes:   No icterus.   Conjunctiva pink. PERRLA. Ears:  Normal auditory acuity. Neck:  Supple; no masses or thyroidomegaly Lungs: Respirations even and unlabored. Lungs clear to auscultation bilaterally.   No wheezes, crackles, or rhonchi.  Heart:  Regular rate and rhythm;  Without murmur, clicks, rubs or gallops Abdomen:  Soft, nondistended,  nontender. Normal bowel sounds. No appreciable masses or hepatomegaly.  No rebound or guarding.  Positive colostomy Rectal:  Not performed. Msk:  Symmetrical without gross deformities.   Extremities:  Without edema, cyanosis or clubbing. Neurologic:  Alert and oriented x3; left hemiparesis. Skin:  Intact without significant lesions or rashes. Cervical Nodes:  No significant cervical adenopathy. Psych:  Alert and cooperative. Normal affect.  LAB RESULTS: Recent Labs    11/15/17 0818 11/16/17 0728  WBC 16.2* 15.2*  HGB 10.4* 9.4*  HCT 32.5* 30.3*  PLT 238 189   BMET Recent Labs    11/15/17 0818 11/16/17 0728  NA 138 137  K 4.3 4.5  CL 98 106  CO2 23  15*  GLUCOSE 118* 143*  BUN 25* 31*  CREATININE 3.85* 4.67*  CALCIUM 7.3* 5.8*   LFT Recent Labs    11/16/17 0728  PROT 6.1*  ALBUMIN 2.1*  AST 236*  ALT 76*  ALKPHOS 460*  BILITOT 3.3*   PT/INR Recent Labs    11/15/17 0818  LABPROT 16.7*  INR 1.36    STUDIES: US Renal  Result Date: 11/16/2017 CLINICAL DATA:  Acute renal failure, metastatic colonic malignancy to the skeleton. EXAM: RENAL / URINARY TRACT ULTRASOUND COMPLETE COMPARISON:  Abdominal and pelvic CT scan of October 02, 2017 FINDINGS: Right Kidney: Length: 12.1 cm. The renal cortical echotexture is increased but remains lower than that of the adjacent liver. There is no hydronephrosis. No suspicious masses are observed. Left Kidney: Length: 12.0 cm. The renal cortical echotexture of the left kidney is similar to that on the right. There is no hydronephrosis. No suspicious masses are observed. Bladder: The urinary bladder is decompressed by Foley catheter. IMPRESSION: Mildly increased renal cortical echotexture likely reflects medical renal disease. There is no hydronephrosis. No parenchymal masses are observed. The urinary bladder is decompressed by Foley catheter and cannot be adequately assessed. Electronically Signed   By: David  Martinique M.D.   On:  11/16/2017 12:46   Dg Chest Port 1 View  Result Date: 11/15/2017 CLINICAL DATA:  Sepsis, altered mental status EXAM: PORTABLE CHEST 1 VIEW COMPARISON:  CT chest 10/02/2017 FINDINGS: Cardiac enlargement. Bilateral mild airspace disease is symmetric and could represent edema. Bibasilar atelectasis. Small left effusion. IMPRESSION: Mild bilateral airspace disease possibly edema. Left lower lobe atelectasis/infiltrate and small left effusion. Electronically Signed   By: Franchot Gallo M.D.   On: 11/15/2017 09:22   US Abdomen Limited Ruq  Result Date: 11/15/2017 CLINICAL DATA:  Sepsis and elevated LFTs EXAM: ULTRASOUND ABDOMEN LIMITED RIGHT UPPER QUADRANT COMPARISON:  None. FINDINGS: Gallbladder: No gallstones or wall thickening visualized. No sonographic Murphy sign noted by sonographer. Common bile duct: Diameter: 3.8 mm Liver: Diffuse increased echogenicity is noted consistent with fatty infiltration. No focal mass is noted. Portal vein is patent on color Doppler imaging with normal direction of blood flow towards the liver. IMPRESSION: Fatty liver Electronically Signed   By: Inez Catalina M.D.   On: 11/15/2017 12:17      Impression / Plan:   Assessment: Principal Problem:   Severe sepsis ([place]) Active Problems:   Acute lower UTI   Acute renal failure (ARF) ([facility])   Elevated LFTs   Zamiya Dillard is a 62 y.o. y/o female with increased transaminases.  The patient has a history of metastatic colon cancer.  Plan:  The patient's increased liver enzymes may be due to her sepsis versus possible infiltrative disease from her metastatic colon cancer.  The patient will have her labs sent off for other possible causes of the abnormal liver enzymes.  Other possibilities include medications as the cause of her abnormal liver enzymes.  The husband and patient have been explained that we are going to check more blood tests to see what may be causing her abnormal liver enzymes.  The patient did have fatty  liver found on the ultrasound which can cause abnormal liver enzymes but not to the extent that is seen here.  The patient and her husband have been explained the plan and agree with it.  Thank you for involving me in the care of this patient.      LOS: 1 day   Lucilla Lame, MD  11/16/2017, 4:08 PM  Note: This dictation was prepared with Dragon dictation along with smaller phrase technology. Any transcriptional errors that result from this process are unintentional.

## 2017-11-16 NOTE — Consult Note (Signed)
.   Date: 11/16/2017                  Patient Name:  Theresa Brock  MRN: [id number]  DOB: [birth year]  Age / Sex: 62 y.o., female         PCP: Trinna Post, PA-C                 Service Requesting Consult: IM/ Salary, Avel Peace, MD                 Reason for Consult: ARF            History of Present Illness: Patient is a 62 y.o. female with medical problems of metastatic colon cancer, s/p resection, ileostomy, h/o stroke and right sided weakness, who was admitted to [facility] on 11/15/2017 for evaluation of decreased Level of consciousness. Found to be hypotensive and tachycardic upon arrival. Dx with sepsis.  At home gradually worsening functional status Requiring morphine for chronic pain due to metastases Renal consult for ARF  Results for Theresa, Brock (MRN [id number]) as of 11/16/2017 13:03  Ref. Range 11/09/2017 15:27 11/15/2017 08:18 11/16/2017 07:28  Creatinine Latest Ref Range: 0.44 - 1.00 mg/dL 0.88 3.85 (H) 4.67 (H)   Given iv fluid resuscitation. Renal U/S negative for acute issues  Patient had large dose radiation therapy last Monday. Since then she has been feeling poorly. Had one episode of nausea and vomiting. BP was low normal on thursday at SBP of 120. HR 111 Oral intake has been poor No NSAIDs, no iv contrast exposure   Medications: Outpatient medications: Medications Prior to Admission  Medication Sig Dispense Refill Last Dose  . atorvastatin (LIPITOR) 20 MG tablet Take 1 tablet (20 mg total) by mouth daily at 6 PM. 90 tablet 1 Taking  . dexamethasone (DECADRON) 4 MG tablet Take 2 tablets (8 mg total) by mouth daily. With food 14 tablet 0 11/14/2017 at Unknown time  . docusate sodium (COLACE) 250 MG capsule Take 250 mg by mouth daily.   Taking  . losartan (COZAAR) 25 MG tablet Take 1 tablet (25 mg total) by mouth daily. 90 tablet 1 11/14/2017 at Unknown time  . morphine (MS CONTIN) 15 MG 12 hr tablet Take 1 tablet (15 mg total) by mouth every 12 (twelve)  hours for 7 days. 14 tablet 0 Taking  . morphine (MSIR) 30 MG tablet Take 1 tablet (30 mg total) by mouth every 4 (four) hours as needed for severe pain. (Patient taking differently: Take 30 mg by mouth every 4 (four) hours as needed for severe pain. Taking every 6 hours) 180 tablet 0   . Multiple Vitamin (MULTIVITAMIN WITH MINERALS) TABS tablet Take 1 tablet by mouth daily.   Taking  . acetaminophen (TYLENOL) 325 MG tablet Take 650 mg by mouth every 6 (six) hours as needed.   Not Taking at Unknown time  . fentaNYL (DURAGESIC - DOSED MCG/HR) 12 MCG/HR Place 1 patch (12.5 mcg total) onto the skin every 3 (three) days. (Patient not taking: Reported on 11/12/2017) 10 patch 0 Not Taking at Unknown time  . loperamide (IMODIUM A-D) 2 MG tablet Take 1 tablet (2 mg total) by mouth 3 (three) times daily as needed. Take 2 at onset , then 1 every 2hr with each BM. Max dose 16 mg (Patient not taking: Reported on 10/28/2017) 100 tablet 1 Not Taking at Unknown time  . LORazepam (ATIVAN) 1 MG tablet Take 1 tablet (1 mg total) by mouth every  6 (six) hours as needed (NAUSEA). (Patient not taking: Reported on 10/28/2017) 30 tablet 0 Not Taking at Unknown time  . OLANZapine (ZYPREXA) 5 MG tablet Take 1 tablet (5 mg total) by mouth at bedtime. 30 tablet 1   . ondansetron (ZOFRAN) 8 MG tablet Take 1 tablet (8 mg total) by mouth 2 (two) times daily as needed for refractory nausea / vomiting. Start on day 3 after chemotherapy. (Patient not taking: Reported on 10/28/2017) 30 tablet 1 Not Taking at Unknown time  . prochlorperazine (COMPAZINE) 10 MG tablet Take 1 tablet (10 mg total) by mouth every 6 (six) hours as needed (NAUSEA). (Patient not taking: Reported on 10/28/2017) 30 tablet 1 Not Taking at Unknown time    Current medications: Current Facility-Administered Medications  Medication Dose Route Frequency Provider Last Rate Last Dose  . 0.9 %  sodium chloride infusion   Intravenous Continuous Salary, Montell D, MD 150 mL/hr  at 11/16/17 1126    . acetaminophen (TYLENOL) tablet 650 mg  650 mg Oral Q6H PRN Vaughan Basta, MD      . atorvastatin (LIPITOR) tablet 20 mg  20 mg Oral q1800 Vaughan Basta, MD   20 mg at 11/15/17 1744  . ceFEPIme (MAXIPIME) 2 g in sodium chloride 0.9 % 100 mL IVPB  2 g Intravenous Q24H Vaughan Basta, MD   Stopped at 11/16/17 [phone]  . dexamethasone (DECADRON) tablet 8 mg  8 mg Oral Daily Vaughan Basta, MD   8 mg at 11/16/17 1122  . docusate sodium (COLACE) capsule 100 mg  100 mg Oral BID PRN Vaughan Basta, MD      . heparin injection 5,000 Units  5,000 Units Subcutaneous Q8H Vaughan Basta, MD   5,000 Units at 11/16/17 0526  . MEDLINE mouth rinse  15 mL Mouth Rinse BID Vaughan Basta, MD   15 mL at 11/16/17 1122  . morphine (MSIR) tablet 15 mg  15 mg Oral Q4H PRN Vaughan Basta, MD   15 mg at 11/16/17 0529  . multivitamin with minerals tablet 1 tablet  1 tablet Oral Daily Vaughan Basta, MD   1 tablet at 11/16/17 1121  . [START ON 11/17/2017] vancomycin (VANCOCIN) IVPB 750 mg/150 ml premix  750 mg Intravenous Q48H Shanlever, Pierce Crane, RPH          Allergies: No Known Allergies    Past Medical History: Past Medical History:  Diagnosis Date  . Colon cancer ([place])   . Diplopia 07/05/2014   Overview:  Converted from Centricity: Description - DIPLOPIA  . Dysphagia, oropharyngeal phase 09/20/2013   Overview:  Converted from Centricity: Description - DYSPHAGIA, OROPHARYNGEAL PHASE  . Essential (primary) hypertension 09/20/2013   Overview:  Converted from Centricity: Description - HYPERTENSION, BENIGN ESSENTIAL  . GERD (gastroesophageal reflux disease)   . Hyperlipidemia   . Hypertension   . Malignant neoplasm of colon ([place]) 01/23/2017  . Stroke ([place])   . Tinea unguium 09/20/2013   Overview:  Converted from Centricity: Description - ONYCHOMYCOSIS, TOENAILS     Past Surgical History: Past Surgical History:   Procedure Laterality Date  . COLOSTOMY    . FLEXIBLE SIGMOIDOSCOPY N/A 05/14/2017   Procedure: FLEXIBLE SIGMOIDOSCOPY;  Surgeon: Lin Landsman, MD;  Location: [facility] ENDOSCOPY;  Service: Gastroenterology;  Laterality: N/A;  . PORT A CATH INJECTION ([place] HX) Right    7/18     Family History: Family History  Problem Relation Age of Onset  . Throat cancer Brother   . Stroke Mother   . Lung  cancer Father      Social History: Social History   Socioeconomic History  . Marital status: Married    Spouse name: Not on file  . Number of children: Not on file  . Years of education: Not on file  . Highest education level: Not on file  Occupational History  . Not on file  Social Needs  . Financial resource strain: Not on file  . Food insecurity:    Worry: Not on file    Inability: Not on file  . Transportation needs:    Medical: Not on file    Non-medical: Not on file  Tobacco Use  . Smoking status: Never Smoker  . Smokeless tobacco: Never Used  Substance and Sexual Activity  . Alcohol use: No  . Drug use: No  . Sexual activity: Not on file  Lifestyle  . Physical activity:    Days per week: Not on file    Minutes per session: Not on file  . Stress: Not on file  Relationships  . Social connections:    Talks on phone: Not on file    Gets together: Not on file    Attends religious service: Not on file    Active member of club or organization: Not on file    Attends meetings of clubs or organizations: Not on file    Relationship status: Not on file  . Intimate partner violence:    Fear of current or ex partner: Not on file    Emotionally abused: Not on file    Physically abused: Not on file    Forced sexual activity: Not on file  Other Topics Concern  . Not on file  Social History Narrative  . Not on file     Review of Systems: Gen: fever at the time of admission HEENT: decreased hearing CV: no chest pain, SOB Resp: no cough, sputum or hemoptysis GI: +  Nausea, Vomiting x 1. None at present; ileostomy working well GU : decreased UOP; has foley now MS: left sided weakness; ambulates with cane Derm:  no c/o Psych: no c/o Heme: no c/o Neuro: left sided weakness from stroke Endocrine. No c/o  Vital Signs: Blood pressure 92/62, pulse 96, temperature 98 F (36.7 C), temperature source Oral, resp. rate 20, height 5' (1.524 m), weight 60 kg, SpO2 99 %.   Intake/Output Summary (Last 24 hours) at 11/16/2017 1258 Last data filed at 11/16/2017 1126 Gross per 24 hour  Intake 3841.91 ml  Output 315 ml  Net 3526.91 ml    Weight trends: [employer]   11/15/17 9371  Weight: 60 kg    Physical Exam: General:  Laying in the bed, obese, no acute distress, eating lunch  HEENT  moist oral mucous membranes  Neck:  Supple, no masses  Lungs:  Normal breathing effort on room air, clear to auscultation bilaterally  Heart::  Regular rhythm  Abdomen: Soft, NT, non distended  Extremities:  trace edema on left leg  Neurologic: Left hemiparesis, alert, able to answer few questions  Skin: Normal turgor, no acute rashes, + jaundice  Foley: present       Lab results: Basic Metabolic Panel: Recent Labs  Lab 11/09/17 1527 11/15/17 0818 11/16/17 0728  NA 134* 138 137  K 3.9 4.3 4.5  CL 96* 98 106  CO2 23 23 15*  GLUCOSE 139* 118* 143*  BUN 11 25* 31*  CREATININE 0.88 3.85* 4.67*  CALCIUM 10.4* 7.3* 5.8*    Liver Function Tests: Recent  Labs  Lab 11/16/17 0728  AST 236*  ALT 76*  ALKPHOS 460*  BILITOT 3.3*  PROT 6.1*  ALBUMIN 2.1*   No results for input(s): LIPASE, AMYLASE in the last 168 hours. No results for input(s): AMMONIA in the last 168 hours.  CBC: Recent Labs  Lab 11/15/17 0818 11/16/17 0728  WBC 16.2* 15.2*  NEUTROABS 12.1*  --   HGB 10.4* 9.4*  HCT 32.5* 30.3*  MCV 95.6 98.4  PLT 238 189    Cardiac Enzymes: No results for input(s): CKTOTAL, TROPONINI in the last 168 hours.  BNP: Invalid input(s):  POCBNP  CBG: No results for input(s): GLUCAP in the last 168 hours.  Microbiology: Recent Results (from the past 720 hour(s))  Culture, blood (Routine x 2)     Status: None (Preliminary result)   Collection Time: 11/15/17  8:11 AM  Result Value Ref Range Status   Specimen Description BLOOD LAC  Final   Special Requests   Final    BOTTLES DRAWN AEROBIC AND ANAEROBIC Blood Culture results may not be optimal due to an excessive volume of blood received in culture bottles   Culture   Final    NO GROWTH < 24 HOURS Performed at [facility], [address], [place], [place] [address]    Report Status PENDING  Incomplete  Culture, blood (Routine x 2)     Status: None (Preliminary result)   Collection Time: 11/15/17  8:11 AM  Result Value Ref Range Status   Specimen Description BLOOD L HAND  Final   Special Requests   Final    BOTTLES DRAWN AEROBIC AND ANAEROBIC Blood Culture adequate volume   Culture   Final    NO GROWTH < 24 HOURS Performed at [facility], [address] [place], [place] [address]    Report Status PENDING  Incomplete  Urine culture     Status: Abnormal (Preliminary result)   Collection Time: 11/15/17  8:11 AM  Result Value Ref Range Status   Specimen Description   Final    URINE, RANDOM Performed at [facility], [address], [place], [place] [address]    Special Requests   Final    NONE Performed at [facility], [address] [place], [place] [address]    Culture >=100,000 COLONIES/mL KLEBSIELLA PNEUMONIAE (A)  Final   Report Status PENDING  Incomplete     Coagulation Studies: Recent Labs    11/15/17 0818  LABPROT 16.7*  INR 1.36    Urinalysis: Recent Labs    11/15/17 0811  COLORURINE YELLOW*  LABSPEC 1.011  PHURINE 7.0  GLUCOSEU NEGATIVE  HGBUR SMALL*  BILIRUBINUR NEGATIVE  KETONESUR 5*  PROTEINUR 100*  NITRITE NEGATIVE  LEUKOCYTESUR MODERATE*        Imaging: US Renal  Result Date:  11/16/2017 CLINICAL DATA:  Acute renal failure, metastatic colonic malignancy to the skeleton. EXAM: RENAL / URINARY TRACT ULTRASOUND COMPLETE COMPARISON:  Abdominal and pelvic CT scan of October 02, 2017 FINDINGS: Right Kidney: Length: 12.1 cm. The renal cortical echotexture is increased but remains lower than that of the adjacent liver. There is no hydronephrosis. No suspicious masses are observed. Left Kidney: Length: 12.0 cm. The renal cortical echotexture of the left kidney is similar to that on the right. There is no hydronephrosis. No suspicious masses are observed. Bladder: The urinary bladder is decompressed by Foley catheter. IMPRESSION: Mildly increased renal cortical echotexture likely reflects medical renal disease. There is no hydronephrosis. No parenchymal masses  are observed. The urinary bladder is decompressed by Foley catheter and cannot be adequately assessed. Electronically Signed   By: David  Martinique M.D.   On: 11/16/2017 12:46   Dg Chest Port 1 View  Result Date: 2017-12-14 CLINICAL DATA:  Sepsis, altered mental status EXAM: PORTABLE CHEST 1 VIEW COMPARISON:  CT chest 10/02/2017 FINDINGS: Cardiac enlargement. Bilateral mild airspace disease is symmetric and could represent edema. Bibasilar atelectasis. Small left effusion. IMPRESSION: Mild bilateral airspace disease possibly edema. Left lower lobe atelectasis/infiltrate and small left effusion. Electronically Signed   By: Franchot Gallo M.D.   On: 2017/12/14 09:22   US Abdomen Limited Ruq  Result Date: 12/14/17 CLINICAL DATA:  Sepsis and elevated LFTs EXAM: ULTRASOUND ABDOMEN LIMITED RIGHT UPPER QUADRANT COMPARISON:  None. FINDINGS: Gallbladder: No gallstones or wall thickening visualized. No sonographic Murphy sign noted by sonographer. Common bile duct: Diameter: 3.8 mm Liver: Diffuse increased echogenicity is noted consistent with fatty infiltration. No focal mass is noted. Portal vein is patent on color Doppler imaging with  normal direction of blood flow towards the liver. IMPRESSION: Fatty liver Electronically Signed   By: Inez Catalina M.D.   On: 12/14/17 12:17      Assessment & Plan: Pt is a 62 y.o. 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 ARF Oliguric with worsening Creatinine No recent NSAIDs, iv contrast exposure. Baseline creatinine normal at 0.88 on 11/09/17 Renal U/S negative Differential diagnosis includes ATN from hypotension and sepsis Monitor renal labs daily and monitor UOP Check u/a  Dose meds for CrCl < 10 until UOP improves  At present, Electrolytes and Volume status are acceptable No acute indication for Dialysis     LOS: [place] 10/14/201912:58 PM  [place], [place]  Note: This note was prepared with Dragon dictation. Any transcription errors are unintentional

## 2017-11-16 NOTE — Consult Note (Addendum)
Pharmacy Antibiotic Note  Theresa Brock is a 62 y.o. female admitted on 11/15/2017 with sepsis.  Pharmacy has been consulted for vancomycin and cefepime dosing. PMH includes colon cancer with metastasis to bone (recent radiation), GERD, hyperlipidemia, hypertension, stroke with left-sided hemiparesis, dysphagia, colon resection surgery more than a year ago due to colon cancer and status post ileostomy. She has no recent admissions here at [facility].    Plan: 1) Vancomycin 750mg  IV every 48 hours beginning 48 hours after first 1000mg  dose in the ED.  Her Scr is highly elevated above her baseline level of less than 1 (very recently measured) however it has declined from Scr 3.85 >> 4.67  For this reason I have not ordered a vancomycin trough.  K 0.016  T1/2: 43h  Vd 42L  Goal trough 15-20 mcg/mL.  Calculated concentrations at steady-state: 30.7/14.6 mcg/mL  Will draw vancomycin  trough 0800 10/15 to verify level due to fluctuating renal fxn (to assure pt is clearing Vanc even though not at steady state)  Will follow Scr 0800 10/15   2) cefepime 2 grams IV every 24 hours  Height: 5' (152.4 cm) Weight: 132 lb 4.4 oz (60 kg) IBW/kg (Calculated) : 45.5  Temp (24hrs), Avg:100.3 F (37.9 C), Min:98 F (36.7 C), Max:100.8 F (38.2 C)  Recent Labs  Lab 11/09/17 1527 11/15/17 0818 11/15/17 1033 11/16/17 0728  WBC  --  16.2*  --  15.2*  CREATININE 0.88 3.85*  --  4.67*  LATICACIDVEN  --  2.2* 1.2  --     Estimated Creatinine Clearance: 9.9 mL/min (A) (by C-G formula based on SCr of 4.67 mg/dL (H)).    No Known Allergies  Antimicrobials this admission: vancomycin 10/13 >>  cefepime 10/13 >>   Microbiology results: 10/13 BCx: pending 10/13 UCx: pending   Thank you for allowing pharmacy to be a part of this patient's care.  Lu Duffel, PharmD Clinical Pharmacist 11/16/2017 9:01 AM

## 2017-11-16 NOTE — Progress Notes (Signed)
[facility] [place] NAME: Theresa Brock    MR#:  [id number]  DATE OF BIRTH:  [birth year]  SUBJECTIVE:  CHIEF COMPLAINT:   Chief Complaint  Patient presents with  . Code Sepsis  Patient is encephalopathic-primarily due to pain medications, daughter and husband are at the bedside-want DNI status, would like to have oncology see patient for continuity of care, nephrology consulted for worsening acute renal failure, noticed jaundice appearance with elevated liver function tests-gastroenterology to see, patient's family not interested in palliative care/hospice at this time REVIEW OF SYSTEMS:  CONSTITUTIONAL: No fever, fatigue or weakness.  EYES: No blurred or double vision.  EARS, NOSE, AND THROAT: No tinnitus or ear pain.  RESPIRATORY: No cough, shortness of breath, wheezing or hemoptysis.  CARDIOVASCULAR: No chest pain, orthopnea, edema.  GASTROINTESTINAL: No nausea, vomiting, diarrhea or abdominal pain.  GENITOURINARY: No dysuria, hematuria.  ENDOCRINE: No polyuria, nocturia,  HEMATOLOGY: No anemia, easy bruising or bleeding SKIN: No rash or lesion. MUSCULOSKELETAL: No joint pain or arthritis.   NEUROLOGIC: No tingling, numbness, weakness.  PSYCHIATRY: No anxiety or depression.   ROS  DRUG ALLERGIES:  No Known Allergies  VITALS:  Blood pressure 92/62, pulse 96, temperature 98 F (36.7 C), temperature source Oral, resp. rate 20, height 5' (1.524 m), weight 60 kg, SpO2 99 %.  PHYSICAL EXAMINATION:  GENERAL:  62 y.o.-year-old patient lying in the bed with no acute distress.  EYES: Pupils equal, round, reactive to light and accommodation. No scleral icterus. Extraocular muscles intact.  HEENT: Head atraumatic, normocephalic. Oropharynx and nasopharynx clear.  NECK:  Supple, no jugular venous distention. No thyroid enlargement, no tenderness.  LUNGS: Normal breath sounds bilaterally, no wheezing, rales,rhonchi or crepitation. No use of  accessory muscles of respiration.  CARDIOVASCULAR: S1, S2 normal. No murmurs, rubs, or gallops.  ABDOMEN: Soft, nontender, nondistended. Bowel sounds present. No organomegaly or mass.  EXTREMITIES: No pedal edema, cyanosis, or clubbing.  NEUROLOGIC: Cranial nerves II through XII are intact. Muscle strength 5/5 in all extremities. Sensation intact. Gait not checked.  PSYCHIATRIC: The patient is alert and oriented x 3.  SKIN: No obvious rash, lesion, or ulcer.   Physical Exam LABORATORY PANEL:   CBC Recent Labs  Lab 11/16/17 0728  WBC 15.2*  HGB 9.4*  HCT 30.3*  PLT 189   ------------------------------------------------------------------------------------------------------------------  Chemistries  Recent Labs  Lab 11/16/17 0728  NA 137  K 4.5  CL 106  CO2 15*  GLUCOSE 143*  BUN 31*  CREATININE 4.67*  CALCIUM 5.8*  AST 236*  ALT 76*  ALKPHOS 460*  BILITOT 3.3*   ------------------------------------------------------------------------------------------------------------------  Cardiac Enzymes No results for input(s): TROPONINI in the last 168 hours. ------------------------------------------------------------------------------------------------------------------  RADIOLOGY:  Dg Chest Port 1 View  Result Date: 11/15/2017 CLINICAL DATA:  Sepsis, altered mental status EXAM: PORTABLE CHEST 1 VIEW COMPARISON:  CT chest 10/02/2017 FINDINGS: Cardiac enlargement. Bilateral mild airspace disease is symmetric and could represent edema. Bibasilar atelectasis. Small left effusion. IMPRESSION: Mild bilateral airspace disease possibly edema. Left lower lobe atelectasis/infiltrate and small left effusion. Electronically Signed   By: Franchot Gallo M.D.   On: 11/15/2017 09:22   US Abdomen Limited Ruq  Result Date: 11/15/2017 CLINICAL DATA:  Sepsis and elevated LFTs EXAM: ULTRASOUND ABDOMEN LIMITED RIGHT UPPER QUADRANT COMPARISON:  None. FINDINGS: Gallbladder: No gallstones or wall  thickening visualized. No sonographic Murphy sign noted by sonographer. Common bile duct: Diameter: 3.8 mm Liver: Diffuse increased echogenicity is noted consistent with fatty  infiltration. No focal mass is noted. Portal vein is patent on color Doppler imaging with normal direction of blood flow towards the liver. IMPRESSION: Fatty liver Electronically Signed   By: Inez Catalina M.D.   On: 11/15/2017 12:17    ASSESSMENT AND PLAN:  *Acute severe sepsis Stable Continue sepsis protocol, empiric cefepime/vancomycin, follow-up on cultures  *Acute worsening renal failure with oliguria Nephrology to see Check renal ultrasound, strict I&O monitoring, IV fluids for rehydration, avoid nephrotoxic agents  *Acute transaminitis with jaundice appearance Suspect secondary to sepsis, compounded by fatty liver disease Avoid hepatotoxic agents, CMP daily, gastroenterology to see Abdominal ultrasound noted for fatty liver Recent scan noted for mets to bone, not liver  *Hyperlipidemia Statin therapy discontinued given acute transaminitis   *Acute hypotension with history of hypertension  Resolved  Secondary to sepsis   *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  Stable  Continue current pain regiment   *Chronic metastatic colon cancer Status post colectomy and ileostomy Status post chemotherapy last year and radiation therapy to the bones last week. Oncology consulted per family request   *History of stroke with left-sided hemiplegia recent decline due to metastatic cancer and functional quadriplegia  Patient is wheelchair-bound and husband helps at home Increase nursing care PRN, aspiration/fall/skin care precautions while in house  Long-term prognosis is poor-palliative care consulted  All the records are reviewed and case discussed with Care Management/Social Workerr. Management plans discussed with the patient, family and they are in agreement.  CODE STATUS: DNI  TOTAL TIME TAKING  CARE OF THIS PATIENT: 45 minutes.     POSSIBLE D/C IN 3 DAYS, DEPENDING ON CLINICAL CONDITION.   Avel Peace [place] M.D on 11/16/2017   Between 7am to 6pm - Pager - [phone]  After 6pm go to [email or web address] - password [email or web address] [facility]  Office  [phone]  CC: Primary care physician; Trinna Post, PA-C  Note: This dictation was prepared with Dragon dictation along with smaller phrase technology. Any transcriptional errors that result from this process are unintentional.

## 2017-11-16 NOTE — Progress Notes (Signed)
Patient complaining of nausea; no anti-emetics ordered at this time; On-call Hospitalist paged; awaiting callback. Barbaraann Faster, RN 8:34 PM 11/16/2017

## 2017-11-17 ENCOUNTER — Ambulatory Visit: Payer: BLUE CROSS/BLUE SHIELD | Admitting: Physical Therapy

## 2017-11-17 ENCOUNTER — Inpatient Hospital Stay: Payer: BLUE CROSS/BLUE SHIELD

## 2017-11-17 ENCOUNTER — Ambulatory Visit: Payer: BLUE CROSS/BLUE SHIELD

## 2017-11-17 ENCOUNTER — Telehealth: Payer: Self-pay | Admitting: *Deleted

## 2017-11-17 DIAGNOSIS — R6520 Severe sepsis without septic shock: Secondary | ICD-10-CM

## 2017-11-17 DIAGNOSIS — A419 Sepsis, unspecified organism: Secondary | ICD-10-CM

## 2017-11-17 DIAGNOSIS — R945 Abnormal results of liver function studies: Secondary | ICD-10-CM

## 2017-11-17 DIAGNOSIS — L8990 Pressure ulcer of unspecified site, unspecified stage: Secondary | ICD-10-CM

## 2017-11-17 DIAGNOSIS — Z515 Encounter for palliative care: Secondary | ICD-10-CM

## 2017-11-17 LAB — GAMMA GT: GGT: 162 U/L — ABNORMAL HIGH (ref 7–50)

## 2017-11-17 LAB — CBC WITH DIFFERENTIAL/PLATELET
Basophils Absolute: 0 10*3/uL (ref 0.0–0.1)
Basophils Relative: 0 %
EOS ABS: 0 10*3/uL (ref 0.0–0.5)
EOS PCT: 0 %
HCT: 25.8 % — ABNORMAL LOW (ref 36.0–46.0)
Hemoglobin: 8.3 g/dL — ABNORMAL LOW (ref 12.0–15.0)
LYMPHS PCT: 4 %
Lymphs Abs: 0.7 10*3/uL (ref 0.7–4.0)
MCH: 30.4 pg (ref 26.0–34.0)
MCHC: 32.2 g/dL (ref 30.0–36.0)
MCV: 94.5 fL (ref 80.0–100.0)
METAMYELOCYTES PCT: 2 %
MYELOCYTES: 2 %
Monocytes Absolute: 0 10*3/uL — ABNORMAL LOW (ref 0.1–1.0)
Monocytes Relative: 0 %
NRBC: 0 % (ref 0.0–0.2)
Neutro Abs: 17.5 10*3/uL — ABNORMAL HIGH (ref 1.7–7.7)
Neutrophils Relative %: 92 %
Platelets: 250 10*3/uL (ref 150–400)
RBC: 2.73 MIL/uL — ABNORMAL LOW (ref 3.87–5.11)
RDW: 13.9 % (ref 11.5–15.5)
SMEAR REVIEW: UNDETERMINED
WBC: 18.2 10*3/uL — AB (ref 4.0–10.5)

## 2017-11-17 LAB — COMPREHENSIVE METABOLIC PANEL
ALT: 71 U/L — AB (ref 0–44)
AST: 184 U/L — ABNORMAL HIGH (ref 15–41)
Albumin: 1.8 g/dL — ABNORMAL LOW (ref 3.5–5.0)
Alkaline Phosphatase: 432 U/L — ABNORMAL HIGH (ref 38–126)
Anion gap: 12 (ref 5–15)
BILIRUBIN TOTAL: 1.6 mg/dL — AB (ref 0.3–1.2)
BUN: 43 mg/dL — ABNORMAL HIGH (ref 8–23)
CHLORIDE: 113 mmol/L — AB (ref 98–111)
CO2: 12 mmol/L — ABNORMAL LOW (ref 22–32)
CREATININE: 5.38 mg/dL — AB (ref 0.44–1.00)
Calcium: 5.7 mg/dL — CL (ref 8.9–10.3)
GFR, EST AFRICAN AMERICAN: 9 mL/min — AB (ref >60)
GFR, EST NON AFRICAN AMERICAN: 8 mL/min — AB (ref >60)
Glucose, Bld: 154 mg/dL — ABNORMAL HIGH (ref 70–99)
Potassium: 4.3 mmol/L (ref 3.5–5.1)
Sodium: 137 mmol/L (ref 135–145)
TOTAL PROTEIN: 5.5 g/dL — AB (ref 6.5–8.1)

## 2017-11-17 LAB — PROTIME-INR
INR: 1.82
Prothrombin Time: 20.9 seconds — ABNORMAL HIGH (ref 11.4–15.2)

## 2017-11-17 LAB — URINE CULTURE

## 2017-11-17 LAB — HEMOGLOBIN AND HEMATOCRIT, BLOOD
HEMATOCRIT: 27.3 % — AB (ref 36.0–46.0)
HEMATOCRIT: 27.4 % — AB (ref 36.0–46.0)
HEMOGLOBIN: 8.5 g/dL — AB (ref 12.0–15.0)
HEMOGLOBIN: 8.8 g/dL — AB (ref 12.0–15.0)

## 2017-11-17 LAB — TYPE AND SCREEN
ABO/RH(D): O POS
Antibody Screen: NEGATIVE

## 2017-11-17 LAB — FERRITIN: Ferritin: >7500 ng/mL — ABNORMAL HIGH (ref 11–307)

## 2017-11-17 LAB — IRON AND TIBC
IRON: 68 ug/dL (ref 28–170)
SATURATION RATIOS: 60 % — AB (ref 10.4–31.8)
TIBC: 114 ug/dL — AB (ref 250–450)
UIBC: 46 ug/dL

## 2017-11-17 LAB — OCCULT BLOOD X 1 CARD TO LAB, STOOL: FECAL OCCULT BLD: POSITIVE — AB

## 2017-11-17 LAB — HIV ANTIBODY (ROUTINE TESTING W REFLEX): HIV SCREEN 4TH GENERATION: NONREACTIVE

## 2017-11-17 LAB — BRAIN NATRIURETIC PEPTIDE: B Natriuretic Peptide: 489 pg/mL — ABNORMAL HIGH (ref 0.0–100.0)

## 2017-11-17 MED ORDER — SODIUM CHLORIDE 0.9 % IV SOLN
1.0000 g | INTRAVENOUS | Status: DC
  Filled 2017-11-17: qty 1

## 2017-11-17 MED ORDER — IPRATROPIUM-ALBUTEROL 0.5-2.5 (3) MG/3ML IN SOLN
3.0000 mL | RESPIRATORY_TRACT | Status: DC | PRN
  Administered 2017-11-17 (×2): 3 mL via RESPIRATORY_TRACT
  Filled 2017-11-17 (×2): qty 3

## 2017-11-17 MED ORDER — FUROSEMIDE 10 MG/ML IJ SOLN
80.0000 mg | Freq: Two times a day (BID) | INTRAMUSCULAR | Status: DC
  Administered 2017-11-17 – 2017-11-18 (×3): 80 mg via INTRAVENOUS
  Filled 2017-11-17 (×3): qty 8

## 2017-11-17 MED ORDER — CIPROFLOXACIN IN D5W 200 MG/100ML IV SOLN
200.0000 mg | Freq: Two times a day (BID) | INTRAVENOUS | Status: DC
  Administered 2017-11-17 (×2): 200 mg via INTRAVENOUS
  Filled 2017-11-17 (×4): qty 100

## 2017-11-17 MED ORDER — CALCIUM CARBONATE ANTACID 500 MG PO CHEW
400.0000 mg | CHEWABLE_TABLET | Freq: Three times a day (TID) | ORAL | Status: DC
  Administered 2017-11-17: 400 mg via ORAL
  Filled 2017-11-17: qty 2

## 2017-11-17 MED ORDER — SODIUM CHLORIDE 0.9 % IV SOLN
1.0000 g | Freq: Two times a day (BID) | INTRAVENOUS | Status: AC
  Administered 2017-11-17 (×2): 1 g via INTRAVENOUS
  Filled 2017-11-17 (×2): qty 10

## 2017-11-17 MED ORDER — SODIUM CHLORIDE 0.9 % IV SOLN
INTRAVENOUS | Status: DC | PRN
  Administered 2017-11-17: 17:00:00 via INTRAVENOUS

## 2017-11-17 MED ORDER — HYDROMORPHONE HCL 1 MG/ML IJ SOLN
0.5000 mg | INTRAMUSCULAR | Status: DC | PRN
  Administered 2017-11-17 – 2017-11-18 (×5): 0.5 mg via INTRAVENOUS
  Filled 2017-11-17 (×5): qty 0.5

## 2017-11-17 MED ORDER — SODIUM BICARBONATE 650 MG PO TABS
650.0000 mg | ORAL_TABLET | Freq: Three times a day (TID) | ORAL | Status: DC
  Administered 2017-11-17 – 2017-11-18 (×4): 650 mg via ORAL
  Filled 2017-11-17 (×6): qty 1

## 2017-11-17 MED ORDER — CIPROFLOXACIN IN D5W 400 MG/200ML IV SOLN
400.0000 mg | Freq: Two times a day (BID) | INTRAVENOUS | Status: DC
  Filled 2017-11-17 (×2): qty 200

## 2017-11-17 NOTE — Progress Notes (Signed)
Dr. Jodell Cipro notified of critical Calcium of 5.7; low urine output and elevated BUN/Cr; acknowledged; new order written. Barbaraann Faster, RN 6:50 AM 11/17/2017

## 2017-11-17 NOTE — Progress Notes (Signed)
[place], [place] 11/17/17  Subjective:   Patient remains critically ill.  Appetite is very poor.  She is able to eat only a few bites No shortness of breath Serum creatinine further increased to 5.38.  Potassium is normal 4.3 Urine output is minimal  Objective:  Vital signs in last 24 hours:  Temp:  [97.4 F (36.3 C)-98.5 F (36.9 C)] 97.4 F (36.3 C) (10/15 0855) Pulse Rate:  [50-109] 106 (10/15 0855) Resp:  [16-23] 23 (10/15 0855) BP: (96-129)/(58-109) 127/84 (10/15 0855) SpO2:  [94 %-100 %] 98 % (10/15 0855)  Weight change:  Filed Weights   11/15/17 0812  Weight: 60 kg    Intake/Output:    Intake/Output Summary (Last 24 hours) at 11/17/2017 1006 Last data filed at 11/17/2017 2130 Gross per 24 hour  Intake 3317.38 ml  Output 630 ml  Net 2687.38 ml     Physical Exam: General:  Chronically ill-appearing, laying in the bed  HEENT  moist oral mucous membranes  Neck  supple  Pulm/lungs  mild basilar rhonchi, no crackles, room air  CVS/Heart  no rub  Abdomen:   Soft, nontender, nondistended  Extremities:  Trace to 1+ edema on the left  Neurologic:  Left hemiparesis, alert, able to answer questions  Skin:  Normal turgor          Basic Metabolic Panel:  Recent Labs  Lab 11/15/17 0818 11/16/17 0728 11/17/17 0515  NA 138 137 137  K 4.3 4.5 4.3  CL 98 106 113*  CO2 23 15* 12*  GLUCOSE 118* 143* 154*  BUN 25* 31* 43*  CREATININE 3.85* 4.67* 5.38*  CALCIUM 7.3* 5.8* 5.7*     CBC: Recent Labs  Lab 11/15/17 0818 11/16/17 0728 11/17/17 0515 11/17/17 0843  WBC 16.2* 15.2* 18.2*  --   NEUTROABS 12.1*  --  17.5*  --   HGB 10.4* 9.4* 8.3* 8.5*  HCT 32.5* 30.3* 25.8* 27.3*  MCV 95.6 98.4 94.5  --   PLT 238 189 250  --      No results found for: HEPBSAG, HEPBSAB, HEPBIGM    Microbiology:  Recent Results (from the past 240 hour(s))  Culture, blood (Routine x 2)     Status: None (Preliminary result)   Collection  Time: 11/15/17  8:11 AM  Result Value Ref Range Status   Specimen Description BLOOD LAC  Final   Special Requests   Final    BOTTLES DRAWN AEROBIC AND ANAEROBIC Blood Culture results may not be optimal due to an excessive volume of blood received in culture bottles   Culture   Final    NO GROWTH 2 DAYS Performed at [facility], [address] [place], [place] [address]    Report Status PENDING  Incomplete  Culture, blood (Routine x 2)     Status: None (Preliminary result)   Collection Time: 11/15/17  8:11 AM  Result Value Ref Range Status   Specimen Description BLOOD L HAND  Final   Special Requests   Final    BOTTLES DRAWN AEROBIC AND ANAEROBIC Blood Culture adequate volume   Culture   Final    NO GROWTH 2 DAYS Performed at [facility], [address], [place], [place] [address]    Report Status PENDING  Incomplete  Urine culture     Status: Abnormal   Collection Time: 11/15/17  8:11 AM  Result Value Ref Range Status   Specimen Description   Final    URINE, RANDOM  Performed at [facility], [address] [place], [place] [address]    Special Requests   Final    NONE Performed at [facility], [address] [place], [place] [address]    Culture >=100,000 COLONIES/mL KLEBSIELLA PNEUMONIAE (A)  Final   Report Status 11/17/2017 FINAL  Final   Organism ID, Bacteria KLEBSIELLA PNEUMONIAE (A)  Final      Susceptibility   Klebsiella pneumoniae - MIC*    AMPICILLIN >=32 RESISTANT Resistant     CEFAZOLIN <=4 SENSITIVE Sensitive     CEFTRIAXONE <=1 SENSITIVE Sensitive     CIPROFLOXACIN <=0.25 SENSITIVE Sensitive     GENTAMICIN <=1 SENSITIVE Sensitive     IMIPENEM <=0.25 SENSITIVE Sensitive     NITROFURANTOIN 64 INTERMEDIATE Intermediate     TRIMETH/SULFA <=20 SENSITIVE Sensitive     AMPICILLIN/SULBACTAM 8 SENSITIVE Sensitive     PIP/TAZO <=4 SENSITIVE Sensitive     Extended ESBL NEGATIVE Sensitive     * >=100,000 COLONIES/mL  KLEBSIELLA PNEUMONIAE    Coagulation Studies: Recent Labs    11/15/17 0818 11/17/17 0515  LABPROT 16.7* 20.9*  INR 1.36 1.82    Urinalysis: Recent Labs    11/15/17 0811 11/16/17 1537  COLORURINE YELLOW* AMBER*  LABSPEC 1.011 1.010  PHURINE 7.0 6.0  GLUCOSEU NEGATIVE 150*  HGBUR SMALL* MODERATE*  BILIRUBINUR NEGATIVE NEGATIVE  KETONESUR 5* 5*  PROTEINUR 100* 100*  NITRITE NEGATIVE NEGATIVE  LEUKOCYTESUR MODERATE* LARGE*      Imaging: US Renal  Result Date: 11/16/2017 CLINICAL DATA:  Acute renal failure, metastatic colonic malignancy to the skeleton. EXAM: RENAL / URINARY TRACT ULTRASOUND COMPLETE COMPARISON:  Abdominal and pelvic CT scan of October 02, 2017 FINDINGS: Right Kidney: Length: 12.1 cm. The renal cortical echotexture is increased but remains lower than that of the adjacent liver. There is no hydronephrosis. No suspicious masses are observed. Left Kidney: Length: 12.0 cm. The renal cortical echotexture of the left kidney is similar to that on the right. There is no hydronephrosis. No suspicious masses are observed. Bladder: The urinary bladder is decompressed by Foley catheter. IMPRESSION: Mildly increased renal cortical echotexture likely reflects medical renal disease. There is no hydronephrosis. No parenchymal masses are observed. The urinary bladder is decompressed by Foley catheter and cannot be adequately assessed. Electronically Signed   By: David  Martinique M.D.   On: 11/16/2017 12:46   US Abdomen Limited Ruq  Result Date: 11/15/2017 CLINICAL DATA:  Sepsis and elevated LFTs EXAM: ULTRASOUND ABDOMEN LIMITED RIGHT UPPER QUADRANT COMPARISON:  None. FINDINGS: Gallbladder: No gallstones or wall thickening visualized. No sonographic Murphy sign noted by sonographer. Common bile duct: Diameter: 3.8 mm Liver: Diffuse increased echogenicity is noted consistent with fatty infiltration. No focal mass is noted. Portal vein is patent on color Doppler imaging with normal  direction of blood flow towards the liver. IMPRESSION: Fatty liver Electronically Signed   By: Inez Catalina M.D.   On: 11/15/2017 12:17     Medications:   . sodium chloride 150 mL/hr at 11/17/17 0611  . ciprofloxacin     . atorvastatin  20 mg Oral q1800  . calcium carbonate  400 mg of elemental calcium Oral BID WC  . dexamethasone  8 mg Oral Daily  . heparin  5,000 Units Subcutaneous Q8H  . mouth rinse  15 mL Mouth Rinse BID  . multivitamin with minerals  1 tablet Oral Daily  . sodium bicarbonate  650 mg Oral TID   acetaminophen, docusate sodium, HYDROmorphone (DILAUDID) injection, ondansetron (  ZOFRAN) IV  Assessment/ Plan:  62 y.o. 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 ARF Oliguric with worsening Creatinine No recent NSAID or iv contrast exposure. Baseline creatinine normal at 0.88 on 11/09/17 Renal U/S negative Differential diagnosis includes ATN from hypotension and sepsis/UTI Monitor renal labs daily and monitor UOP Dose meds for CrCl < 10 until UOP improves  At present, Electrolytes and Volume status are acceptable No acute indication for Dialysis Reduce the rate of fluid administration to 30 cc/h Encourage oral intake We will consider dose of IV Lasix tomorrow once blood pressure improves.  2.  Urinary tract infection with Klebsiella pneumoniae Currently treated with Cipro IV    LOS: [place] 10/15/201910:06 [place], [place]  Note: This note was prepared with Dragon dictation. Any transcription errors are unintentional

## 2017-11-17 NOTE — Telephone Encounter (Signed)
-----   Message from Wilburn Cornelia sent at 11/16/2017  3:22 PM EDT ----- Regarding: APPT Contact: [phone] Pt has appt Thursday with you and is in hospital- They have left word for you about [place]- Does he still need to bring her Thursday?

## 2017-11-17 NOTE — Consult Note (Addendum)
[place] Nurse ostomy consult note Patient evaluated in [facility] in the presence of her spouse, daughter, and primary RN, Ander Purpura. Stoma type/location: RUQ ileostomy Stomal assessment/size: slightly less than 1 inch top to bottom, 1 and 1/8 inch side to side, oval. Peristomal assessment: intact, normal color and texture Treatment options for stomal/peristomal skin: barrier ring, Lawson # [phone] Output: green effluent Ostomy pouching: 1pc. Ronna Polio # [id number] This is a well established ileostomy patient. The spouse has been performing the pouching and ostomy care.  Prior to today, the last pouch change was Friday of last week.  The patient uses the same pouch we have, except hers has a filter, which we do not have.  And, she uses the barrier tape extenders, also which we do not have.  I have explained the differences to her spouse and daughter.  Also, the patient had a foam dressing on the sacrum at the time of my bedside visit.  Her primary nurse and I removed it as there is moisture associated skin damage to the top of the gluteal cleft.  I have ordered [employer] clear, an air mattress, and linen and brief instructions into the patient's record. Thank you for the consult.  Discussed plan of care with the patient and bedside nurse.  [place] nurse will not follow at this time.  Please re-consult the [place] team if needed.  Val Riles, RN, MSN, CWOCN, CNS-BC, pager [phone]

## 2017-11-17 NOTE — Progress Notes (Signed)
MD notified that pt.'s breathing is very labored at rest, shallow, and wheezing bilaterally. MD also made aware that pt.'s foley output has been less than 200 ml in 12 hours, fluids running at 159ml/hr, and left arm is swollen. New orders placed by MD. VSS, O2 applied to pt for comfort. Fluids stopped at this time and breathing treatment given to pt. Pt.'s breathing/wheezing responded well to breathing treatment. Pt resting more comfortably after breathing treatment and pain medications that were given. RN will continue to monitor pt closely.   Valeriano Bain [employer]

## 2017-11-17 NOTE — Progress Notes (Signed)
Dr. Jodell Cipro notified of patient c/o nausea without PRN ordered; acknowledged; new order written. Barbaraann Faster, RN

## 2017-11-17 NOTE — Progress Notes (Signed)
Hematology/Oncology Consult note [facility]  [phone] [phone]  Patient Care Team: Paulene Floor as PCP - General (Physician Assistant) Sindy Guadeloupe, MD as Medical Oncologist (Medical Oncology)   Name of the patient: Theresa Brock  [id number]  08/11/1955   Date of visit: 11/17/2017   Interval history-patient remains intermittently confused.  She states that her pain is better controlled today after switching to PRN Dilaudid from morphine.  She still does not have any significant urine output and her renal functions continue to worsen  Review of systems- unable to obtain as patient is fatigued and confused    No Known Allergies   Past Medical History:  Diagnosis Date  . Colon cancer ([place])   . Diplopia 07/05/2014   Overview:  Converted from Centricity: Description - DIPLOPIA  . Dysphagia, oropharyngeal phase 09/20/2013   Overview:  Converted from Centricity: Description - DYSPHAGIA, OROPHARYNGEAL PHASE  . Essential (primary) hypertension 09/20/2013   Overview:  Converted from Centricity: Description - HYPERTENSION, BENIGN ESSENTIAL  . GERD (gastroesophageal reflux disease)   . Hyperlipidemia   . Hypertension   . Malignant neoplasm of colon ([place]) 01/23/2017  . Stroke ([place])   . Tinea unguium 09/20/2013   Overview:  Converted from Centricity: Description - ONYCHOMYCOSIS, TOENAILS     Past Surgical History:  Procedure Laterality Date  . COLOSTOMY    . FLEXIBLE SIGMOIDOSCOPY N/A 05/14/2017   Procedure: FLEXIBLE SIGMOIDOSCOPY;  Surgeon: Lin Landsman, MD;  Location: [facility] ENDOSCOPY;  Service: Gastroenterology;  Laterality: N/A;  . PORT A CATH INJECTION ([place] HX) Right    7/18    Social History   Socioeconomic History  . Marital status: Married    Spouse name: Not on file  . Number of children: Not on file  . Years of education: Not on file  . Highest education level: Not on file  Occupational History  .  Not on file  Social Needs  . Financial resource strain: Not on file  . Food insecurity:    Worry: Not on file    Inability: Not on file  . Transportation needs:    Medical: Not on file    Non-medical: Not on file  Tobacco Use  . Smoking status: Never Smoker  . Smokeless tobacco: Never Used  Substance and Sexual Activity  . Alcohol use: No  . Drug use: No  . Sexual activity: Not on file  Lifestyle  . Physical activity:    Days per week: Not on file    Minutes per session: Not on file  . Stress: Not on file  Relationships  . Social connections:    Talks on phone: Not on file    Gets together: Not on file    Attends religious service: Not on file    Active member of club or organization: Not on file    Attends meetings of clubs or organizations: Not on file    Relationship status: Not on file  . Intimate partner violence:    Fear of current or ex partner: Not on file    Emotionally abused: Not on file    Physically abused: Not on file    Forced sexual activity: Not on file  Other Topics Concern  . Not on file  Social History Narrative  . Not on file    Family History  Problem Relation Age of Onset  . Throat cancer Brother   . Stroke Mother   . Lung cancer Father  Current Facility-Administered Medications:  .  0.9 %  sodium chloride infusion, , Intravenous, PRN, Salary, Montell D, MD, Last Rate: 10 mL/hr at 11/17/17 1819 .  acetaminophen (TYLENOL) tablet 650 mg, 650 mg, Oral, Q6H PRN, Vaughan Basta, MD .  atorvastatin (LIPITOR) tablet 20 mg, 20 mg, Oral, q1800, Vaughan Basta, MD, 20 mg at 11/17/17 1653 .  calcium carbonate (TUMS - dosed in mg elemental calcium) chewable tablet 400 mg of elemental calcium, 400 mg of elemental calcium, Oral, TID WC, Salary, Montell D, MD, 400 mg of elemental calcium at 11/17/17 1652 .  calcium gluconate 1 g in sodium chloride 0.9 % 100 mL IVPB, 1 g, Intravenous, BID, Salary, Avel Peace, MD, Stopped at 11/17/17  1752 .  ciprofloxacin (CIPRO) IVPB 200 mg, 200 mg, Intravenous, Q12H, Lu Duffel, RPH, Stopped at 11/17/17 1126 .  dexamethasone (DECADRON) tablet 8 mg, 8 mg, Oral, Daily, Vaughan Basta, MD, 8 mg at 11/17/17 1017 .  docusate sodium (COLACE) capsule 100 mg, 100 mg, Oral, BID PRN, Vaughan Basta, MD .  furosemide (LASIX) injection 80 mg, 80 mg, Intravenous, BID, Salary, Montell D, MD, 80 mg at 11/17/17 1657 .  heparin injection 5,000 Units, 5,000 Units, Subcutaneous, Q8H, Vaughan Basta, MD, 5,000 Units at 11/17/17 1401 .  HYDROmorphone (DILAUDID) injection 0.5 mg, 0.5 mg, Intravenous, Q4H PRN, Borders, Kirt Boys, NP, 0.5 mg at 11/17/17 1822 .  ipratropium-albuterol (DUONEB) 0.5-2.5 (3) MG/3ML nebulizer solution 3 mL, 3 mL, Nebulization, Q4H PRN, Salary, Montell D, MD, 3 mL at 11/17/17 1704 .  MEDLINE mouth rinse, 15 mL, Mouth Rinse, BID, Vaughan Basta, MD, 15 mL at 11/17/17 1027 .  multivitamin with minerals tablet 1 tablet, 1 tablet, Oral, Daily, Vaughan Basta, MD, 1 tablet at 11/16/17 1121 .  ondansetron (ZOFRAN) injection 4 mg, 4 mg, Intravenous, Q4H PRN, Arta Silence, MD, 4 mg at 11/16/17 2058 .  sodium bicarbonate tablet 650 mg, 650 mg, Oral, TID, Salary, Montell D, MD, 650 mg at 11/17/17 1652  Physical exam:  Vitals:   11/17/17 0444 11/17/17 0855 11/17/17 1222 11/17/17 1539  BP: 122/76 127/84 126/66 (!) 147/85  Pulse: (!) 109 (!) 106 (!) 112 (!) 110  Resp: (!) 22 (!) 23 16 18   Temp: 98.3 F (36.8 C) (!) 97.4 F (36.3 C) 98.3 F (36.8 C) 98.4 F (36.9 C)  TempSrc: Oral Oral Oral Oral  SpO2: 98% 98% 98% 100%  Weight:   162 lb (73.5 kg)   Height:       Physical Exam  HENT:  Head: Normocephalic and atraumatic.  Eyes: Pupils are equal, round, and reactive to light. EOM are normal.  Neck: Normal range of motion.  Cardiovascular: Regular rhythm and normal heart sounds.  tachycardic  Pulmonary/Chest: Effort normal and breath  sounds normal.  Abdominal: Soft. Bowel sounds are normal.  Neurological:  Oriented to self. Drowsy. Left sided hemiplegia  Skin: Skin is warm and dry.     CMP Latest Ref Rng & Units 11/17/2017  Glucose 70 - 99 mg/dL 154(H)  BUN 8 - 23 mg/dL 43(H)  Creatinine 0.44 - 1.00 mg/dL 5.38(H)  Sodium 135 - 145 mmol/L 137  Potassium 3.5 - 5.1 mmol/L 4.3  Chloride 98 - 111 mmol/L 113(H)  CO2 22 - 32 mmol/L 12(L)  Calcium 8.9 - 10.3 mg/dL 5.7(LL)  Total Protein 6.5 - 8.1 g/dL 5.5(L)  Total Bilirubin 0.3 - 1.2 mg/dL 1.6(H)  Alkaline Phos 38 - 126 U/L 432(H)  AST 15 - 41 U/L 184(H)  ALT  0 - 44 U/L 71(H)   CBC Latest Ref Rng & Units 11/17/2017  WBC 4.0 - 10.5 K/uL -  Hemoglobin 12.0 - 15.0 g/dL 8.8(L)  Hematocrit 36.0 - 46.0 % 27.4(L)  Platelets 150 - 400 K/uL -    @IMAGES @  Dg Chest 2 View  Result Date: 11/17/2017 CLINICAL DATA:  Shortness of breath EXAM: CHEST - 2 VIEW COMPARISON:  11/15/2013 FINDINGS: Borderline heart size that is stable. Mitral annular calcification. Low lung volumes with interstitial coarsening. There is blunting of the lateral left costophrenic sulcus. IMPRESSION: Low volume chest with vascular congestion and possible small left effusion. Electronically Signed   By: Monte Fantasia M.D.   On: 11/17/2017 11:18   Mr Jeri Cos EL Contrast  Result Date: 11/07/2017 CLINICAL DATA:  62 y/o F; history of right-sided stroke in 2015 as well as metastatic colon cancer. Sudden onset right-sided weakness 5 days ago. EXAM: MRI HEAD WITHOUT AND WITH CONTRAST TECHNIQUE: Multiplanar, multiecho pulse sequences of the brain and surrounding structures were obtained without and with intravenous contrast. CONTRAST:  6.5 cc Gadavist COMPARISON:  None. FINDINGS: Brain: Chronic hemosiderin stained infarction of the right insula, lentiform nucleus, and corona radiata with wallerian degeneration extending into the right cerebral peduncle and brainstem. Additional small foci of susceptibility  hypointensity are present within the left temporal and occipital lobes which may represent hemosiderin deposition of chronic microhemorrhage or tiny cavernoma. Mildly increased T2 and decreased T1 signal within the left caudate head, anterior lentiform nucleus, and insula (series 15, image 14 and 16) with intermediate diffusion on ADC (series 5, image 33 and series 6, image 33). After administration of intravenous contrast there is no abnormal enhancement of the brain. No hydrocephalus, extra-axial collection, mass effect, or herniation. Scattered nonspecific T2 FLAIR hyperintensities in subcortical and periventricular white matter are compatible with moderate chronic microvascular ischemic changes for age. Moderate volume loss of the brain. Vascular: Normal flow voids. Skull and upper cervical spine: Normal marrow signal. Sinuses/Orbits: Negative. Other: None. IMPRESSION: 1. Increased signal within left caudate head, anterior lentiform nucleus, and insula with intermediate diffusion. Findings may represent subacute infarction. Differential includes hypoxic ischemic or toxic metabolic injury. 2. No intracranial metastasis identified. 3. Chronic infarct in the right insula and basal ganglia. Moderate chronic microvascular ischemic changes and volume loss of the brain. Electronically Signed   By: Kristine Garbe M.D.   On: 11/07/2017 00:48   Nm Bone Scan Whole Body  Result Date: 11/02/2017 CLINICAL DATA:  Colon cancer, bone metastases EXAM: NUCLEAR MEDICINE WHOLE BODY BONE SCAN TECHNIQUE: Whole body anterior and posterior images were obtained approximately 3 hours after intravenous injection of radiopharmaceutical. RADIOPHARMACEUTICALS:  23.273 mCi Technetium-48m MDP IV COMPARISON:  None Correlation: CT chest abdomen pelvis 10/02/2017 FINDINGS: Multiple sites of abnormal osseous tracer accumulation are identified consistent with osseous metastatic disease. These include calvarium, proximal and mid RIGHT  humerus, distal sternum, midthoracic and upper lumbar spine, posterior RIGHT rib, multiple sites in pelvis bilaterally, and BILATERAL proximal femora greater on RIGHT. Somewhat pronounced renal cortical retention of tracer bilaterally suggesting renal dysfunction. Expected soft tissue distribution of tracer. IMPRESSION: Multiple osseous metastases as above, including BILATERAL femora and RIGHT humerus as discussed above. Electronically Signed   By: Lavonia Dana M.D.   On: 11/02/2017 17:28   US Renal  Result Date: 11/16/2017 CLINICAL DATA:  Acute renal failure, metastatic colonic malignancy to the skeleton. EXAM: RENAL / URINARY TRACT ULTRASOUND COMPLETE COMPARISON:  Abdominal and pelvic CT scan of October 02, 2017  FINDINGS: Right Kidney: Length: 12.1 cm. The renal cortical echotexture is increased but remains lower than that of the adjacent liver. There is no hydronephrosis. No suspicious masses are observed. Left Kidney: Length: 12.0 cm. The renal cortical echotexture of the left kidney is similar to that on the right. There is no hydronephrosis. No suspicious masses are observed. Bladder: The urinary bladder is decompressed by Foley catheter. IMPRESSION: Mildly increased renal cortical echotexture likely reflects medical renal disease. There is no hydronephrosis. No parenchymal masses are observed. The urinary bladder is decompressed by Foley catheter and cannot be adequately assessed. Electronically Signed   By: David  Martinique M.D.   On: 11/16/2017 12:46   Dg Chest Port 1 View  Result Date: 11/15/2017 CLINICAL DATA:  Sepsis, altered mental status EXAM: PORTABLE CHEST 1 VIEW COMPARISON:  CT chest 10/02/2017 FINDINGS: Cardiac enlargement. Bilateral mild airspace disease is symmetric and could represent edema. Bibasilar atelectasis. Small left effusion. IMPRESSION: Mild bilateral airspace disease possibly edema. Left lower lobe atelectasis/infiltrate and small left effusion. Electronically Signed   By:  Franchot Gallo M.D.   On: 11/15/2017 09:22   US Abdomen Limited Ruq  Result Date: 11/15/2017 CLINICAL DATA:  Sepsis and elevated LFTs EXAM: ULTRASOUND ABDOMEN LIMITED RIGHT UPPER QUADRANT COMPARISON:  None. FINDINGS: Gallbladder: No gallstones or wall thickening visualized. No sonographic Murphy sign noted by sonographer. Common bile duct: Diameter: 3.8 mm Liver: Diffuse increased echogenicity is noted consistent with fatty infiltration. No focal mass is noted. Portal vein is patent on color Doppler imaging with normal direction of blood flow towards the liver. IMPRESSION: Fatty liver Electronically Signed   By: Inez Catalina M.D.   On: 11/15/2017 12:17     Assessment and plan- Patient is a 62 y.o. female with metastatic colon cancer and bone metastases admitted for acute encephalopathy and acute kidney injury  1.  Acute kidney injury: Her renal functions continued to worsen and that has been no significant urine output in the last 24 hours.  Nephrology is on board.  No acute indication for dialysis per nephrology.  I discussed with the patient's husband that her overall prognosis is poor due to her baseline performance status which has been declining since the diagnosis of bone metastases.  At baseline patient had issues with mobility because of prior stroke.  Her recent bone metastases has made it even further difficult for her to ambulate and she has been essentially appropriate to assist and we have been trying to control her pain as an outpatient.  If it were to come to a point the patient needs dialysis, I do not think that this would help the patient in the long run given her overall poor prognosis.  Her husband understands and would like to think about possible options.  Patient always voiced her opinion that she did not want to be connected to any machines and it is unclear if she would have wanted dialysis.  2.  Appreciate assistance by palliative care and continuing to address her goals of  care  3.  Patient's morphine has been stopped and she has been switched to PRN Dilaudid which seems to be working better for her pain and is a safer option in the setting of acute kidney injury  4.  Acute liver injury likely secondary to hypotension.  It is improving.  GI on board   Visit Diagnosis 1. Sepsis, due to unspecified organism, unspecified whether acute organ dysfunction present ([place])   2. Urinary tract infection without hematuria, site unspecified  3. Community acquired pneumonia, unspecified laterality   4. ARF (acute renal failure) ([place])   5. Severe sepsis ([place])   6. SOB (shortness of breath)      Dr. Randa Evens, MD, MPH [facility] at [facility] [phone] 11/17/2017 7:54 PM              '

## 2017-11-17 NOTE — Progress Notes (Signed)
PHARMACY NOTE:  ANTIMICROBIAL RENAL DOSAGE ADJUSTMENT  Current antimicrobial regimen includes a mismatch between antimicrobial dosage and estimated renal function.  As per policy approved by the Pharmacy & Therapeutics and Medical Executive Committees, the antimicrobial dosage will be adjusted accordingly.  Current antimicrobial dosage:  cipro 400mg  q12  Indication: complicated uti   Renal Function:  Estimated Creatinine Clearance: 8.6 mL/min (A) (by C-G formula based on SCr of 5.38 mg/dL (H)).     Antimicrobial dosage has been changed to:  cipro 200mg  q12  Additional comments:   Thank you for allowing pharmacy to be a part of this patient's care.  Lu Duffel, PharmD Clinical Pharmacist 11/17/2017 9:26 AM

## 2017-11-17 NOTE — Progress Notes (Signed)
Theresa Lame, MD [facility]   [address] [place], [place] [address] Phone: [phone] Fax : [phone]   Subjective: The patient's liver enzymes have improved.  She continues to have generalized pain.  The patient was found to have heme positive stools.  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   Objective: Vital signs in last 24 hours: Vitals:   11/17/17 0444 11/17/17 0855 11/17/17 1222 11/17/17 1539  BP: 122/76 127/84 126/66 (!) 147/85  Pulse: (!) 109 (!) 106 (!) 112 (!) 110  Resp: (!) 22 (!) 23 16 18   Temp: 98.3 F (36.8 C) (!) 97.4 F (36.3 C) 98.3 F (36.8 C) 98.4 F (36.9 C)  TempSrc: Oral Oral Oral Oral  SpO2: 98% 98% 98% 100%  Weight:   73.5 kg   Height:       Weight change:   Intake/Output Summary (Last 24 hours) at 11/17/2017 1754 Last data filed at 11/17/2017 1235 Gross per 24 hour  Intake 2594.55 ml  Output 555 ml  Net 2039.55 ml     Exam: Heart:: Regular rate and rhythm, S1S2 present or without murmur or extra heart sounds Lungs: normal and clear to auscultation and percussion Abdomen: soft, nontender, normal bowel sounds ostomy bag noted   Lab Results: @LABTEST2 @ Micro Results: Recent Results (from the past 240 hour(s))  Culture, blood (Routine x 2)     Status: None (Preliminary result)   Collection Time: 11/15/17  8:11 AM  Result Value Ref Range Status   Specimen Description BLOOD LAC  Final   Special Requests   Final    BOTTLES DRAWN AEROBIC AND ANAEROBIC Blood Culture results may not be optimal due to an excessive volume of blood received in culture bottles   Culture   Final    NO GROWTH 2 DAYS Performed at [facility], [address] [place], [place] [address]    Report Status PENDING  Incomplete  Culture, blood (Routine x 2)     Status: None (Preliminary result)   Collection Time: 11/15/17  8:11 AM  Result Value Ref Range Status   Specimen Description BLOOD L HAND  Final   Special  Requests   Final    BOTTLES DRAWN AEROBIC AND ANAEROBIC Blood Culture adequate volume   Culture   Final    NO GROWTH 2 DAYS Performed at [facility], [address] [place], [place] [address]    Report Status PENDING  Incomplete  Urine culture     Status: Abnormal   Collection Time: 11/15/17  8:11 AM  Result Value Ref Range Status   Specimen Description   Final    URINE, RANDOM Performed at [facility], [address] [place], [place] [address]    Special Requests   Final    NONE Performed at [facility], [address] [place], [place] [address]    Culture >=100,000 COLONIES/mL KLEBSIELLA PNEUMONIAE (A)  Final   Report Status 11/17/2017 FINAL  Final   Organism ID, Bacteria KLEBSIELLA PNEUMONIAE (A)  Final      Susceptibility   Klebsiella pneumoniae - MIC*    AMPICILLIN >=32 RESISTANT Resistant     CEFAZOLIN <=4 SENSITIVE Sensitive     CEFTRIAXONE <=1 SENSITIVE Sensitive     CIPROFLOXACIN <=0.25 SENSITIVE Sensitive     GENTAMICIN <=1 SENSITIVE Sensitive     IMIPENEM <=0.25 SENSITIVE Sensitive     NITROFURANTOIN 64 INTERMEDIATE Intermediate     TRIMETH/SULFA <=20  SENSITIVE Sensitive     AMPICILLIN/SULBACTAM 8 SENSITIVE Sensitive     PIP/TAZO <=4 SENSITIVE Sensitive     Extended ESBL NEGATIVE Sensitive     * >=100,000 COLONIES/mL KLEBSIELLA PNEUMONIAE   Studies/Results: Dg Chest 2 View  Result Date: 11/17/2017 CLINICAL DATA:  Shortness of breath EXAM: CHEST - 2 VIEW COMPARISON:  11/15/2013 FINDINGS: Borderline heart size that is stable. Mitral annular calcification. Low lung volumes with interstitial coarsening. There is blunting of the lateral left costophrenic sulcus. IMPRESSION: Low volume chest with vascular congestion and possible small left effusion. Electronically Signed   By: Monte Fantasia M.D.   On: 11/17/2017 11:18   US Renal  Result Date: 11/16/2017 CLINICAL DATA:  Acute renal failure, metastatic colonic malignancy to the  skeleton. EXAM: RENAL / URINARY TRACT ULTRASOUND COMPLETE COMPARISON:  Abdominal and pelvic CT scan of October 02, 2017 FINDINGS: Right Kidney: Length: 12.1 cm. The renal cortical echotexture is increased but remains lower than that of the adjacent liver. There is no hydronephrosis. No suspicious masses are observed. Left Kidney: Length: 12.0 cm. The renal cortical echotexture of the left kidney is similar to that on the right. There is no hydronephrosis. No suspicious masses are observed. Bladder: The urinary bladder is decompressed by Foley catheter. IMPRESSION: Mildly increased renal cortical echotexture likely reflects medical renal disease. There is no hydronephrosis. No parenchymal masses are observed. The urinary bladder is decompressed by Foley catheter and cannot be adequately assessed. Electronically Signed   By: David  Martinique M.D.   On: 11/16/2017 12:46   Medications: I have reviewed the patient's current medications. Scheduled Meds: . atorvastatin  20 mg Oral q1800  . calcium carbonate  400 mg of elemental calcium Oral TID WC  . dexamethasone  8 mg Oral Daily  . furosemide  80 mg Intravenous BID  . heparin  5,000 Units Subcutaneous Q8H  . mouth rinse  15 mL Mouth Rinse BID  . multivitamin with minerals  1 tablet Oral Daily  . sodium bicarbonate  650 mg Oral TID   Continuous Infusions: . sodium chloride 10 mL/hr at 11/17/17 1650  . calcium gluconate 1 g (11/17/17 1652)  . ciprofloxacin Stopped (11/17/17 1126)   PRN Meds:.sodium chloride, acetaminophen, docusate sodium, HYDROmorphone (DILAUDID) injection, ipratropium-albuterol, ondansetron (ZOFRAN) IV   Assessment: Principal Problem:   Severe sepsis (HCC) Active Problems:   Acute lower UTI   Acute renal failure (ARF) (HCC)   Elevated LFTs   Palliative care encounter   Pressure injury of skin    Plan: This patient had elevated liver enzymes with her recent lab work showing a increased iron with a increase GGT ferritin over  7500 and a prolonged INR.  The patient's family has been told that she may be having heme positive stools from stress gastritis versus ulcers.  The patient will be treated with a PPI in lieu of doing any invasive procedures such as an EGD.  The patient and her family have been explained the plan and agree with it.   LOS: 2 days   Theresa Brock 11/17/2017, 5:54 PM

## 2017-11-17 NOTE — Telephone Encounter (Signed)
Called husband to let them know that the patient is going to be in the hospital past Thursday so we will go ahead and cancel the appointment.  She is not doing very well according to the husband but Dr. Janese Banks did come in and see them.  It sounds like the pain is being controlled with Dilaudid per the husband.  And it sounds like cancer is status quo but she has a bad UTI and some kidney issues and liver issues that they are working on.  Once patient is discharged will make a new appointment for them.  Husband understands

## 2017-11-17 NOTE — Progress Notes (Signed)
[place] [facility] [place] NAME: Kloi Brodman    MR#:  [id number]  DATE OF BIRTH:  Mar 17, 1955  SUBJECTIVE:  CHIEF COMPLAINT:   Chief Complaint  Patient presents with  . Code Sepsis  Patient without complaint, noted worsening renal function, now an uric, case discussed with nephrology, wheezing noted by nursing staff-we will decrease IV fluids to [facility], [place], check stat two-view chest x-ray, breathing treatments/oxygen PRN, gastroenterology and oncology input appreciated  REVIEW OF SYSTEMS:  CONSTITUTIONAL: No fever, fatigue or weakness.  EYES: No blurred or double vision.  EARS, NOSE, AND THROAT: No tinnitus or ear pain.  RESPIRATORY: No cough, shortness of breath, wheezing or hemoptysis.  CARDIOVASCULAR: No chest pain, orthopnea, edema.  GASTROINTESTINAL: No nausea, vomiting, diarrhea or abdominal pain.  GENITOURINARY: No dysuria, hematuria.  ENDOCRINE: No polyuria, nocturia,  HEMATOLOGY: No anemia, easy bruising or bleeding SKIN: No rash or lesion. MUSCULOSKELETAL: No joint pain or arthritis.   NEUROLOGIC: No tingling, numbness, weakness.  PSYCHIATRY: No anxiety or depression.   ROS  DRUG ALLERGIES:  No Known Allergies  VITALS:  Blood pressure 127/84, pulse (!) 106, temperature (!) 97.4 F (36.3 C), temperature source Oral, resp. rate (!) 23, height 5' (1.524 m), weight 60 kg, SpO2 98 %.  PHYSICAL EXAMINATION:  GENERAL:  62 y.o.-year-old patient lying in the bed with no acute distress.  EYES: Pupils equal, round, reactive to light and accommodation. No scleral icterus. Extraocular muscles intact.  HEENT: Head atraumatic, normocephalic. Oropharynx and nasopharynx clear.  NECK:  Supple, no jugular venous distention. No thyroid enlargement, no tenderness.  LUNGS: Normal breath sounds bilaterally, no wheezing, rales,rhonchi or crepitation. No use of accessory muscles of respiration.  CARDIOVASCULAR: S1, S2 normal. No murmurs, rubs, or gallops.   ABDOMEN: Soft, nontender, nondistended. Bowel sounds present. No organomegaly or mass.  EXTREMITIES: No pedal edema, cyanosis, or clubbing.  NEUROLOGIC: Cranial nerves II through XII are intact. Muscle strength 5/5 in all extremities. Sensation intact. Gait not checked.  PSYCHIATRIC: The patient is alert and oriented x 3.  SKIN: No obvious rash, lesion, or ulcer.   Physical Exam LABORATORY PANEL:   CBC Recent Labs  Lab 11/17/17 0515 11/17/17 0843  WBC 18.2*  --   HGB 8.3* 8.5*  HCT 25.8* 27.3*  PLT 250  --    ------------------------------------------------------------------------------------------------------------------  Chemistries  Recent Labs  Lab 11/17/17 0515  NA 137  K 4.3  CL 113*  CO2 12*  GLUCOSE 154*  BUN 43*  CREATININE 5.38*  CALCIUM 5.7*  AST 184*  ALT 71*  ALKPHOS 432*  BILITOT 1.6*   ------------------------------------------------------------------------------------------------------------------  Cardiac Enzymes No results for input(s): TROPONINI in the last 168 hours. ------------------------------------------------------------------------------------------------------------------  RADIOLOGY:  US Renal  Result Date: 11/16/2017 CLINICAL DATA:  Acute renal failure, metastatic colonic malignancy to the skeleton. EXAM: RENAL / URINARY TRACT ULTRASOUND COMPLETE COMPARISON:  Abdominal and pelvic CT scan of October 02, 2017 FINDINGS: Right Kidney: Length: 12.1 cm. The renal cortical echotexture is increased but remains lower than that of the adjacent liver. There is no hydronephrosis. No suspicious masses are observed. Left Kidney: Length: 12.0 cm. The renal cortical echotexture of the left kidney is similar to that on the right. There is no hydronephrosis. No suspicious masses are observed. Bladder: The urinary bladder is decompressed by Foley catheter. IMPRESSION: Mildly increased renal cortical echotexture likely reflects medical renal disease. There is  no hydronephrosis. No parenchymal masses are observed. The urinary bladder is decompressed by Foley catheter  and cannot be adequately assessed. Electronically Signed   By: David  Martinique M.D.   On: 11/16/2017 12:46   US Abdomen Limited Ruq  Result Date: 11/15/2017 CLINICAL DATA:  Sepsis and elevated LFTs EXAM: ULTRASOUND ABDOMEN LIMITED RIGHT UPPER QUADRANT COMPARISON:  None. FINDINGS: Gallbladder: No gallstones or wall thickening visualized. No sonographic Murphy sign noted by sonographer. Common bile duct: Diameter: 3.8 mm Liver: Diffuse increased echogenicity is noted consistent with fatty infiltration. No focal mass is noted. Portal vein is patent on color Doppler imaging with normal direction of blood flow towards the liver. IMPRESSION: Fatty liver Electronically Signed   By: Inez Catalina M.D.   On: 11/15/2017 12:17    ASSESSMENT AND PLAN:  *Acute severe sepsis Resolving Continue sepsis protocol, urine culture noted for Klebsiella-change antibiotics to Cipro IV, follow-up on outstanding cultures   *Acute worsening renal failure with anuria  Nephrology input appreciated  Renal ultrasound negative, continue strict I&O monitoring, avoid nephrotoxic agents, daily weights, BMP in the morning, patient may require hemodialysis-family discussion had with all questions answered   *Acute transaminitis with jaundice appearance Improved Suspect secondary to sepsis, compounded by fatty liver disease 2 avoid hepatotoxic agents, CMP daily, gastroenterology input appreciated Abdominal ultrasound noted for fatty liver Recent scan noted for mets to bone, not liver  *Hyperlipidemia Statin therapy discontinued given acute transaminitis   *Acute hypotension with history of hypertension  Resolved  Secondary to sepsis   *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 Oncology input appreciated  *History of stroke with left-sided hemiplegia Stable recent decline due to metastatic cancer and functional quadriplegia  Patient is wheelchair-bound and husband helps at home Continue increased nursing care PRN, aspiration/fall/skin care precautions while in house  Long-term prognosis is poor-palliative care consulted  All the records are reviewed and case discussed with Care Management/Social Workerr. Management plans discussed with the patient, family and they are in agreement.  CODE STATUS: DNI  TOTAL TIME TAKING CARE OF THIS PATIENT: 40 minutes.     POSSIBLE D/C IN 3 DAYS, DEPENDING ON CLINICAL CONDITION.   Avel Peace Geraldean Walen M.D on 11/17/2017   Between 7am to 6pm - Pager - [phone]  After 6pm go to [email or web address] - password [email or web address] [facility]  Office  [phone]  CC: Primary care physician; Trinna Post, PA-C  Note: This dictation was prepared with Dragon dictation along with smaller phrase technology. Any transcriptional errors that result from this process are unintentional.

## 2017-11-17 NOTE — Consult Note (Signed)
[facility]  [phone] [phone]   Name: Theresa Brock Date: 11/17/2017 MRN: [id number]  DOB: 12-30-1955  Patient Care Team: Paulene Floor as PCP - General (Physician Assistant) Sindy Guadeloupe, MD as Medical Oncologist (Medical Oncology)    REASON FOR CONSULTATION: Palliative Care consult requested for this 62 y.o. 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  Patient was status post XRT to the sacrum and right femur.  Patient was admitted to the hospital on 11/15/2017 with sepsis with urine culture noted for Klebsiella.  Unfortunately, her hospitalization has been complicated by acute transaminitis and acute worsening renal failure.  Palliative care was consulted to help with symptom management and goals of care.  SOCIAL HISTORY:    Patient is married.  She lives at home with her husband in an RV that is located at daughter's house.  Patient also has a son in [place].  ADVANCE DIRECTIVES:  Patient's husband is the decision-maker.  Patient has a living will.  CODE STATUS: Limited code  PAST MEDICAL HISTORY: Past Medical History:  Diagnosis Date  . Colon cancer ([place])   . Diplopia 07/05/2014   Overview:  Converted from [facility]: Description - DIPLOPIA  . Dysphagia, oropharyngeal phase 09/20/2013   Overview:  Converted from Centricity: Description - DYSPHAGIA, OROPHARYNGEAL PHASE  . Essential (primary) hypertension 09/20/2013   Overview:  Converted from Centricity: Description - HYPERTENSION, BENIGN ESSENTIAL  . GERD (gastroesophageal reflux disease)   . Hyperlipidemia   . Hypertension    . Malignant neoplasm of colon ([place]) 01/23/2017  . Stroke ([place])   . Tinea unguium 09/20/2013   Overview:  Converted from Centricity: Description - ONYCHOMYCOSIS, TOENAILS    PAST SURGICAL HISTORY:  Past Surgical History:  Procedure Laterality Date  . COLOSTOMY    . FLEXIBLE SIGMOIDOSCOPY N/A 05/14/2017   Procedure: FLEXIBLE SIGMOIDOSCOPY;  Surgeon: Lin Landsman, MD;  Location: [facility] ENDOSCOPY;  Service: Gastroenterology;  Laterality: N/A;  . PORT A CATH INJECTION ([place] [facility]) Right    7/18    HEMATOLOGY/ONCOLOGY HISTORY:  Oncology History   Patient initially presented as 62 year old female with past medical history significant for hypertension, hyperlipidemia, and 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  At baseline she ambulated less than 30 feet with cane and used wheelchair for long distances.  She was diagnosed with stage IIIc adenocarcinoma of the colon in May 2018 in [place], [place].  She had presented with abdominal pain nausea vomiting and intermittent rectal bleeding in April 2018 and was found to have small bowel obstruction at that time.  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  No loss of nuclear expression of MMR proteins. Tumor was poorly differentiated and invades through the muscularis propria into pericolonic tissue. No microscopy perforation. L VIN PNI were present. Margins were negative. pT3pN2b  Patient had a complicated postoperative course following the colectomy with postoperative  ileus C. difficile colitis and was admitted in the ICU for septic shock with pressors. There was a  considerable delay because of that in starting adjuvant chemotherapy. CBC back in July 2018 was normal with a white count of 10.7, H&H of 12/37 and a platelet count of 445. LFTs were within normal limits and serum creatinine was 0.6. CT chest abdomen and pelvis in July 2018 did not reveal any evidence of recurrent or metastatic disease.  Patient was also seen by GYN for thickened endometrium and underwent transvaginal ultrasound in August 2018 which showed a 2.8 cm fibroid as well as endometrial thickening of 12.8 mm. Plan was for hysteroscopy and D&C in September 2018  Adjuvant chemotherapy with FOLFOX was planned every 2 weeks for 12 cycles which started in August 2018. Adjuvant chemotherapy was initiated on 09/30/2016 and, per Dr. Elroy Channel review of records, her last chemotherapy was cycle #5 that was given on 11/27/2016. Patient was admitted to the hospital on 12/07/2016 with renal failure and failure to thrive and did not desire further chemotherapy after that point.  Patient lives in an [place] and travels between [place] and [place].  Per patient, she returned to baseline after stopping chemotherapy in October 2018 and has been on surveillance since that time.  10/02/2017-CT imaging revealed new large 5.3 cm destructive lesion in the left sacrum consistent with metastatic colon cancer.  Lesion encroaches on the left S1 nerve root.  No other evidence of metastatic disease identified in the chest, abdomen, or pelvis.  She was experiencing pain and was evaluated by radiation oncology for palliative radiation.  Bone Scan revealed: Multiple osseous metastases including BILATERAL femora and RIGHT humerus.   11/06/2017- MRI Brain d/t increased weakness. Negative for intracranial metastasis.      Malignant neoplasm of colon ([place])   01/23/2017 Initial Diagnosis    Malignant neoplasm of colon ([place])    10/25/2017 -  Chemotherapy    The patient had palonosetron (ALOXI) injection 0.25 mg, 0.25 mg,  Intravenous,  Once, 0 of 4 cycles irinotecan (CAMPTOSAR) 300 mg in dextrose 5 % 500 mL chemo infusion, 180 mg/m2, Intravenous,  Once, 0 of 4 cycles leucovorin 672 mg in dextrose 5 % 250 mL infusion, 400 mg/m2, Intravenous,  Once, 0 of 4 cycles fluorouracil (ADRUCIL) chemo injection 650 mg, 400 mg/m2, Intravenous,  Once, 0 of 4 cycles fluorouracil (ADRUCIL) 4,050 mg in sodium chloride 0.9 % 69 mL chemo infusion, 2,400 mg/m2, Intravenous, 1 Day/Dose, 0 of 4 cycles  for chemotherapy treatment.      Bone metastases ([place])   10/25/2017 -  Chemotherapy    The patient had palonosetron (ALOXI) injection 0.25 mg, 0.25 mg, Intravenous,  Once, 0 of 4 cycles irinotecan (CAMPTOSAR) 300 mg in dextrose 5 % 500 mL chemo infusion, 180 mg/m2, Intravenous,  Once, 0 of 4 cycles leucovorin 672 mg in dextrose 5 % 250 mL infusion, 400 mg/m2, Intravenous,  Once, 0 of 4 cycles fluorouracil (ADRUCIL) chemo injection 650 mg, 400 mg/m2, Intravenous,  Once, 0 of 4 cycles fluorouracil (ADRUCIL) 4,050 mg in sodium chloride 0.9 % 69 mL chemo infusion, 2,400 mg/m2, Intravenous, 1 Day/Dose, 0 of 4 cycles  for chemotherapy treatment.     10/26/2017 Initial Diagnosis    Bone metastases ([facility])     ALLERGIES:  has No Known Allergies.  MEDICATIONS:  Current Facility-Administered Medications  Medication Dose Route Frequency Provider Last Rate Last Dose  . 0.9 %  sodium chloride infusion   Intravenous Continuous Murlean Iba, MD 30  mL/hr at 11/17/17 1018    . acetaminophen (TYLENOL) tablet 650 mg  650 mg Oral Q6H PRN Vaughan Basta, MD      . atorvastatin (LIPITOR) tablet 20 mg  20 mg Oral q1800 Vaughan Basta, MD   20 mg at 11/16/17 1734  . calcium carbonate (TUMS - dosed in mg elemental calcium) chewable tablet 400 mg of elemental calcium  400 mg of elemental calcium Oral BID WC Salary, Montell D, MD      . ciprofloxacin (CIPRO) IVPB 200 mg  200 mg Intravenous Q12H Shanlever, Pierce Crane, RPH      .  dexamethasone (DECADRON) tablet 8 mg  8 mg Oral Daily Vaughan Basta, MD   8 mg at 11/17/17 1017  . docusate sodium (COLACE) capsule 100 mg  100 mg Oral BID PRN Vaughan Basta, MD      . heparin injection 5,000 Units  5,000 Units Subcutaneous Q8H Vaughan Basta, MD   5,000 Units at 11/17/17 0529  . HYDROmorphone (DILAUDID) injection 0.5 mg  0.5 mg Intravenous Q4H PRN Borders, Kirt Boys, NP   0.5 mg at 11/17/17 1008  . MEDLINE mouth rinse  15 mL Mouth Rinse BID Vaughan Basta, MD   15 mL at 11/16/17 2233  . multivitamin with minerals tablet 1 tablet  1 tablet Oral Daily Vaughan Basta, MD   1 tablet at 11/16/17 1121  . ondansetron (ZOFRAN) injection 4 mg  4 mg Intravenous Q4H PRN Arta Silence, MD   4 mg at 11/16/17 2058  . sodium bicarbonate tablet 650 mg  650 mg Oral TID Loney Hering D, MD   650 mg at 11/17/17 1018    VITAL SIGNS: BP 127/84 (BP Location: Right Arm)   Pulse (!) 106   Temp (!) 97.4 F (36.3 C) (Oral)   Resp (!) 23   Ht 5' (1.524 m)   Wt 132 lb 4.4 oz (60 kg)   SpO2 98%   BMI 25.83 kg/m  Filed Weights   11/15/17 0812  Weight: 132 lb 4.4 oz (60 kg)    Estimated body mass index is 25.83 kg/m as calculated from the following:   Height as of this encounter: 5' (1.524 m).   Weight as of this encounter: 132 lb 4.4 oz (60 kg).  LABS: CBC:    Component Value Date/Time   WBC 18.2 (H) 11/17/2017 0515   HGB 8.5 (L) 11/17/2017 0843   HCT 27.3 (L) 11/17/2017 0843   PLT 250 11/17/2017 0515   MCV 94.5 11/17/2017 0515   NEUTROABS 17.5 (H) 11/17/2017 0515   LYMPHSABS 0.7 11/17/2017 0515   MONOABS 0.0 (L) 11/17/2017 0515   EOSABS 0.0 11/17/2017 0515   BASOSABS 0.0 11/17/2017 0515   Comprehensive Metabolic Panel:    Component Value Date/Time   NA 137 11/17/2017 0515   NA 142 06/09/2017 1546   K 4.3 11/17/2017 0515   CL 113 (H) 11/17/2017 0515   CO2 12 (L) 11/17/2017 0515   BUN 43 (H) 11/17/2017 0515   BUN 17 06/09/2017  1546   CREATININE 5.38 (H) 11/17/2017 0515   GLUCOSE 154 (H) 11/17/2017 0515   CALCIUM 5.7 (LL) 11/17/2017 0515   AST 184 (H) 11/17/2017 0515   ALT 71 (H) 11/17/2017 0515   ALKPHOS 432 (H) 11/17/2017 0515   BILITOT 1.6 (H) 11/17/2017 0515   BILITOT 0.2 06/09/2017 1546   PROT 5.5 (L) 11/17/2017 0515   PROT 7.1 06/09/2017 1546   ALBUMIN 1.8 (L) 11/17/2017 0515   ALBUMIN 4.3 06/09/2017  1546    RADIOGRAPHIC STUDIES: Mr Jeri Cos Wo Contrast  Result Date: 11/07/2017 CLINICAL DATA:  62 y/o F; history of right-sided stroke in 2015 as well as metastatic colon cancer. Sudden onset right-sided weakness 5 days ago. EXAM: MRI HEAD WITHOUT AND WITH CONTRAST TECHNIQUE: Multiplanar, multiecho pulse sequences of the brain and surrounding structures were obtained without and with intravenous contrast. CONTRAST:  6.5 cc Gadavist COMPARISON:  None. FINDINGS: Brain: Chronic hemosiderin stained infarction of the right insula, lentiform nucleus, and corona radiata with wallerian degeneration extending into the right cerebral peduncle and brainstem. Additional small foci of susceptibility hypointensity are present within the left temporal and occipital lobes which may represent hemosiderin deposition of chronic microhemorrhage or tiny cavernoma. Mildly increased T2 and decreased T1 signal within the left caudate head, anterior lentiform nucleus, and insula (series 15, image 14 and 16) with intermediate diffusion on ADC (series 5, image 33 and series 6, image 33). After administration of intravenous contrast there is no abnormal enhancement of the brain. No hydrocephalus, extra-axial collection, mass effect, or herniation. Scattered nonspecific T2 FLAIR hyperintensities in subcortical and periventricular white matter are compatible with moderate chronic microvascular ischemic changes for age. Moderate volume loss of the brain. Vascular: Normal flow voids. Skull and upper cervical spine: Normal marrow signal. Sinuses/Orbits:  Negative. Other: None. IMPRESSION: 1. Increased signal within left caudate head, anterior lentiform nucleus, and insula with intermediate diffusion. Findings may represent subacute infarction. Differential includes hypoxic ischemic or toxic metabolic injury. 2. No intracranial metastasis identified. 3. Chronic infarct in the right insula and basal ganglia. Moderate chronic microvascular ischemic changes and volume loss of the brain. Electronically Signed   By: Kristine Garbe M.D.   On: 11/07/2017 00:48   Nm Bone Scan Whole Body  Result Date: 11/02/2017 CLINICAL DATA:  Colon cancer, bone metastases EXAM: NUCLEAR MEDICINE WHOLE BODY BONE SCAN TECHNIQUE: Whole body anterior and posterior images were obtained approximately 3 hours after intravenous injection of radiopharmaceutical. RADIOPHARMACEUTICALS:  23.273 mCi Technetium-53mMDP IV COMPARISON:  None Correlation: CT chest abdomen pelvis 10/02/2017 FINDINGS: Multiple sites of abnormal osseous tracer accumulation are identified consistent with osseous metastatic disease. These include calvarium, proximal and mid RIGHT humerus, distal sternum, midthoracic and upper lumbar spine, posterior RIGHT rib, multiple sites in pelvis bilaterally, and BILATERAL proximal femora greater on RIGHT. Somewhat pronounced renal cortical retention of tracer bilaterally suggesting renal dysfunction. Expected soft tissue distribution of tracer. IMPRESSION: Multiple osseous metastases as above, including BILATERAL femora and RIGHT humerus as discussed above. Electronically Signed   By: MLavonia DanaM.D.   On: 11/02/2017 17:28   UKoreaRenal  Result Date: 11/16/2017 CLINICAL DATA:  Acute renal failure, metastatic colonic malignancy to the skeleton. EXAM: RENAL / URINARY TRACT ULTRASOUND COMPLETE COMPARISON:  Abdominal and pelvic CT scan of October 02, 2017 FINDINGS: Right Kidney: Length: 12.1 cm. The renal cortical echotexture is increased but remains lower than that of the  adjacent liver. There is no hydronephrosis. No suspicious masses are observed. Left Kidney: Length: 12.0 cm. The renal cortical echotexture of the left kidney is similar to that on the right. There is no hydronephrosis. No suspicious masses are observed. Bladder: The urinary bladder is decompressed by Foley catheter. IMPRESSION: Mildly increased renal cortical echotexture likely reflects medical renal disease. There is no hydronephrosis. No parenchymal masses are observed. The urinary bladder is decompressed by Foley catheter and cannot be adequately assessed. Electronically Signed   By: David  JMartiniqueM.D.   On: 11/16/2017 12:46  Dg Chest Port 1 View  Result Date: 11/15/2017 CLINICAL DATA:  Sepsis, altered mental status EXAM: PORTABLE CHEST 1 VIEW COMPARISON:  CT chest 10/02/2017 FINDINGS: Cardiac enlargement. Bilateral mild airspace disease is symmetric and could represent edema. Bibasilar atelectasis. Small left effusion. IMPRESSION: Mild bilateral airspace disease possibly edema. Left lower lobe atelectasis/infiltrate and small left effusion. Electronically Signed   By: Franchot Gallo M.D.   On: 11/15/2017 09:22   US Abdomen Limited Ruq  Result Date: 11/15/2017 CLINICAL DATA:  Sepsis and elevated LFTs EXAM: ULTRASOUND ABDOMEN LIMITED RIGHT UPPER QUADRANT COMPARISON:  None. FINDINGS: Gallbladder: No gallstones or wall thickening visualized. No sonographic Murphy sign noted by sonographer. Common bile duct: Diameter: 3.8 mm Liver: Diffuse increased echogenicity is noted consistent with fatty infiltration. No focal mass is noted. Portal vein is patent on color Doppler imaging with normal direction of blood flow towards the liver. IMPRESSION: Fatty liver Electronically Signed   By: Inez Catalina M.D.   On: 11/15/2017 12:17    PERFORMANCE STATUS (ECOG) : 3 - Symptomatic, >50% confined to bed  Review of Systems Unable to fully provide  Physical Exam General: Frail-appearing Cardiovascular: regular  rate and rhythm Pulmonary: clear ant fields Abdomen: soft, nontender, + bowel sounds GU: no suprapubic tenderness Extremities: no edema, no joint deformities Skin: no rashes Neurological: Alert but confused, left-sided weakness  IMPRESSION: Patient is alert but confused.  He does not know where she is nor is she able to participate meaningfully in a conversation regarding her goals.  It does not appear that patient currently has capacity for decision-making.  Would need to rely on family's guidance regarding medical decision-making.  Worsening renal function noted.  Serum creatinine has increased to 5.38 from 4.67 yesterday.  Urine output is less than 200 mL in past 12 hours.  Of note the transaminitis appears to be improving.  Patient continues to receive treatment for sepsis.  Confusion likely secondary to worsening renal function.  However, patient has also received 3 doses of morphine in the past 24 hours.  Morphine is not an ideal opioid for use with renal dysfunction given its propensity for causing neurotoxicity.  We will stop morphine and switch to hydromorphone.  I met with patient's husband and daughter, both of whom were at the bedside.  Family updated on current medical problems.  Both seem to recognize the potential for future decline and a poor outcome.  However, both remain currently committed to ongoing treatment in the hope that patient will improve.  It seems likely that family would likely opt for hemodialysis if necessary, unless the medical team felt that were a futile endeavor.  Family suggest that if it becomes clear that patient will not have a meaningful recovery or return to an acceptable quality of life, they would likely opt for less aggressive measures.  We discussed CODE STATUS.  Patient is currently a limited code.  Patient had previously verbalized a desire not to have her life prolonged on a ventilator.  However, family feel that she would want an attempt made at  cardiac resuscitation.  They recognize that CPR and defibrillation might ultimately prove futile.  Again, family suggest that they would be willing to revisit these decisions based on patient's progress over the coming days.  Case discussed with hospitalist, nephrology, and supervising physician.  PLAN: Continue supportive care Limited code Recommend discontinuing IV fluids given worsening renal function Will stop morphine given risk for neurotoxicity Start hydromorphone 0.5 mg IV every 4 hours as needed  for pain Will follow  Patient expressed understanding and was in agreement with this plan. She also understands that She can call clinic at any time with any questions, concerns, or complaints.   Time Total: 60 minutes  Visit consisted of counseling and education dealing with the complex and emotionally intense issues of symptom management and palliative care in the setting of serious and potentially life-threatening illness.Greater than 50%  of this time was spent counseling and coordinating care related to the above assessment and plan.  Signed by: Altha Harm, [place], NP-C, [place] (Work Cell)

## 2017-11-18 ENCOUNTER — Ambulatory Visit: Payer: BLUE CROSS/BLUE SHIELD

## 2017-11-18 LAB — CBC WITH DIFFERENTIAL/PLATELET
ABS IMMATURE GRANULOCYTES: 2.83 10*3/uL — AB (ref 0.00–0.07)
BASOS PCT: 1 %
Basophils Absolute: 0.1 10*3/uL (ref 0.0–0.1)
EOS ABS: 0 10*3/uL (ref 0.0–0.5)
Eosinophils Relative: 0 %
HCT: 24.9 % — ABNORMAL LOW (ref 36.0–46.0)
Hemoglobin: 7.9 g/dL — ABNORMAL LOW (ref 12.0–15.0)
Immature Granulocytes: 15 %
Lymphocytes Relative: 3 %
Lymphs Abs: 0.5 10*3/uL — ABNORMAL LOW (ref 0.7–4.0)
MCH: 30 pg (ref 26.0–34.0)
MCHC: 31.7 g/dL (ref 30.0–36.0)
MCV: 94.7 fL (ref 80.0–100.0)
MONOS PCT: 7 %
Monocytes Absolute: 1.3 10*3/uL — ABNORMAL HIGH (ref 0.1–1.0)
NEUTROS PCT: 74 %
NRBC: 0.1 % (ref 0.0–0.2)
Neutro Abs: 14.6 10*3/uL — ABNORMAL HIGH (ref 1.7–7.7)
PLATELETS: 122 10*3/uL — AB (ref 150–400)
RBC: 2.63 MIL/uL — ABNORMAL LOW (ref 3.87–5.11)
RDW: 14.1 % (ref 11.5–15.5)
Smear Review: NORMAL
WBC: 19.3 10*3/uL — AB (ref 4.0–10.5)

## 2017-11-18 LAB — IGG, IGA, IGM
IGA: 352 mg/dL (ref 87–352)
IGM (IMMUNOGLOBULIN M), SRM: 107 mg/dL (ref 26–217)
IgG (Immunoglobin G), Serum: 718 mg/dL (ref 700–1600)

## 2017-11-18 LAB — COMPREHENSIVE METABOLIC PANEL
ALK PHOS: 418 U/L — AB (ref 38–126)
ALT: 66 U/L — AB (ref 0–44)
AST: 156 U/L — ABNORMAL HIGH (ref 15–41)
Albumin: 1.8 g/dL — ABNORMAL LOW (ref 3.5–5.0)
Anion gap: 12 (ref 5–15)
BUN: 56 mg/dL — ABNORMAL HIGH (ref 8–23)
CHLORIDE: 111 mmol/L (ref 98–111)
CO2: 15 mmol/L — ABNORMAL LOW (ref 22–32)
Calcium: 6.4 mg/dL — CL (ref 8.9–10.3)
Creatinine, Ser: 6.23 mg/dL — ABNORMAL HIGH (ref 0.44–1.00)
GFR, EST AFRICAN AMERICAN: 7 mL/min — AB (ref >60)
GFR, EST NON AFRICAN AMERICAN: 6 mL/min — AB (ref >60)
Glucose, Bld: 144 mg/dL — ABNORMAL HIGH (ref 70–99)
Potassium: 4.7 mmol/L (ref 3.5–5.1)
Sodium: 138 mmol/L (ref 135–145)
TOTAL PROTEIN: 5.5 g/dL — AB (ref 6.5–8.1)
Total Bilirubin: 1.1 mg/dL (ref 0.3–1.2)

## 2017-11-18 LAB — HEPATITIS PANEL, ACUTE
HEP A IGM: NEGATIVE
HEP B C IGM: NEGATIVE
Hepatitis B Surface Ag: NEGATIVE

## 2017-11-18 LAB — ANTI-SMOOTH MUSCLE ANTIBODY, IGG: F-Actin IgG: 7 Units (ref 0–19)

## 2017-11-18 LAB — CALCIUM, IONIZED: Calcium, Ionized, Serum: 3.2 mg/dL — ABNORMAL LOW (ref 4.5–5.6)

## 2017-11-18 LAB — MITOCHONDRIAL ANTIBODIES

## 2017-11-18 LAB — ANTINUCLEAR ANTIBODIES, IFA: ANA Ab, IFA: NEGATIVE

## 2017-11-18 LAB — HEMOGLOBIN AND HEMATOCRIT, BLOOD
HEMATOCRIT: 27.1 % — AB (ref 36.0–46.0)
HEMATOCRIT: 26.6 % — AB (ref 36.0–46.0)
HEMOGLOBIN: 8.5 g/dL — AB (ref 12.0–15.0)
Hemoglobin: 8.6 g/dL — ABNORMAL LOW (ref 12.0–15.0)

## 2017-11-18 LAB — CERULOPLASMIN: Ceruloplasmin: 37.3 mg/dL (ref 19.0–39.0)

## 2017-11-18 LAB — ALPHA-1-ANTITRYPSIN: A-1 Antitrypsin, Ser: 372 mg/dL — ABNORMAL HIGH (ref 90–200)

## 2017-11-18 MED ORDER — OLANZAPINE 5 MG PO TABS: 5.0000 mg | ORAL_TABLET | Freq: Every day | ORAL | 0 refills | Status: AC

## 2017-11-18 MED ORDER — LORAZEPAM 0.5 MG PO TABS: 0.5000 mg | ORAL_TABLET | ORAL | 0 refills | Status: AC | PRN

## 2017-11-18 MED ORDER — DEXAMETHASONE 4 MG PO TABS: 8.0000 mg | ORAL_TABLET | Freq: Every day | ORAL | 0 refills | Status: AC

## 2017-11-18 MED ORDER — MORPHINE SULFATE ER 15 MG PO TBCR: 15.0000 mg | EXTENDED_RELEASE_TABLET | Freq: Two times a day (BID) | ORAL | 0 refills | Status: AC

## 2017-11-18 MED ORDER — MORPHINE SULFATE 30 MG PO TABS: 30.0000 mg | ORAL_TABLET | ORAL | 0 refills | Status: AC | PRN

## 2017-11-18 MED ORDER — LORAZEPAM 2 MG/ML IJ SOLN
0.5000 mg | INTRAMUSCULAR | Status: DC | PRN
  Administered 2017-11-18: 0.5 mg via INTRAVENOUS
  Filled 2017-11-18: qty 1

## 2017-11-18 MED ORDER — DOCUSATE SODIUM 250 MG PO CAPS: 250.0000 mg | ORAL_CAPSULE | Freq: Every day | ORAL | 0 refills | Status: AC

## 2017-11-18 MED ORDER — HYDROMORPHONE HCL 1 MG/ML IJ SOLN
0.5000 mg | INTRAMUSCULAR | Status: DC | PRN
  Administered 2017-11-18 (×2): 0.5 mg via INTRAVENOUS
  Filled 2017-11-18 (×2): qty 0.5

## 2017-11-18 NOTE — Progress Notes (Signed)
New hospice home referral received from [facility] following a palliative Medicine consult. Patient is a 62 year old woman with a history of metastatic colon cancer s/p ileostomy  with bony mets to her right leg and spine. She was admitted to [facility] on 10/13 with sepsis with urine culture noted for Klebsiella, acute renal failure and increased pain. She has received IV antibiotics and steroids however her renal function has continued to decline. Family met with palliative NP Josh Borders this morning and have chosen to focus on comfort with transfer to the hospice home. Writer met in the room with patient's husband Wynetta Emery and daughter Earnest Bailey to initiate education regarding hospice services, philosophy and team approach to care with understanding voiced. Questions answered, consents signed. Patient information faxed to referral.  Patient seen lying in bed, continues to recognize family, but speech very difficult to understand. Wynetta Emery has explained the transfer to the hospice home. Patient did become very anxious during visit. Education provided to family and they were agreeable for writer to request medication. Staff RN Cory Roughen notified, and order received for lorazepam 0.5 mg q 2 hrs PRN. Patient appeared more comfortable at end of visit. Report called to the hospice home. Hospital team updated. Writer to notify EMS for transport with pick up at 5 pm. Flo Shanks RN, BSN, [facility], hospital Liaison [phone]

## 2017-11-18 NOTE — Progress Notes (Signed)
[place], [place] 11/18/17  Subjective:   Patient remains critically ill.  Appetite is very poor.  Ate a cup of chicken broth yesterday at lunch. No shortness of breath Serum creatinine further increased to 6.23.   Urine output is minimal  Objective:  Vital signs in last 24 hours:  Temp:  [97.7 F (36.5 C)-98.4 F (36.9 C)] 97.9 F (36.6 C) (10/16 0629) Pulse Rate:  [105-149] 105 (10/16 0629) Resp:  [16-22] 22 (10/15 2314) BP: (125-147)/(66-86) 125/82 (10/16 0629) SpO2:  [97 %-100 %] 99 % (10/16 0629) Weight:  [73.5 kg] 73.5 kg (10/15 1222)  Weight change:  Filed Weights   11/15/17 0812 11/17/17 1222  Weight: 60 kg 73.5 kg    Intake/Output:    Intake/Output Summary (Last 24 hours) at 11/18/2017 0943 Last data filed at 11/18/2017 1540 Gross per 24 hour  Intake 1036.81 ml  Output 250 ml  Net 786.81 ml     Physical Exam: General:  Chronically ill-appearing, laying in the bed  HEENT  moist oral mucous membranes  Neck  supple  Pulm/lungs  Kathryn O2  CVS/Heart  no rub  Abdomen:   Soft, nontender, nondistended  Extremities:  Trace to 1+ edema on the left  Neurologic:  Left hemiparesis, confused, mumbled speech  Skin:  Normal turgor          Basic Metabolic Panel:  Recent Labs  Lab 11/15/17 0818 11/16/17 0728 11/17/17 0515 11/18/17 0604  NA 138 137 137 138  K 4.3 4.5 4.3 4.7  CL 98 106 113* 111  CO2 23 15* 12* 15*  GLUCOSE 118* 143* 154* 144*  BUN 25* 31* 43* 56*  CREATININE 3.85* 4.67* 5.38* 6.23*  CALCIUM 7.3* 5.8* 5.7* 6.4*     CBC: Recent Labs  Lab 11/15/17 0818 11/16/17 0728 11/17/17 0515 11/17/17 0843 11/17/17 1551 11/17/17 2356 11/18/17 0604 11/18/17 0753  WBC 16.2* 15.2* 18.2*  --   --   --  19.3*  --   NEUTROABS 12.1*  --  17.5*  --   --   --  14.6*  --   HGB 10.4* 9.4* 8.3* 8.5* 8.8* 8.6* 7.9* 8.5*  HCT 32.5* 30.3* 25.8* 27.3* 27.4* 27.1* 24.9* 26.6*  MCV 95.6 98.4 94.5  --   --   --  94.7  --   PLT  238 189 250  --   --   --  122*  --       Lab Results  Component Value Date   HEPBSAG Negative 11/17/2017   HEPBIGM Negative 11/17/2017      Microbiology:  Recent Results (from the past 240 hour(s))  Culture, blood (Routine x 2)     Status: None (Preliminary result)   Collection Time: 11/15/17  8:11 AM  Result Value Ref Range Status   Specimen Description BLOOD LAC  Final   Special Requests   Final    BOTTLES DRAWN AEROBIC AND ANAEROBIC Blood Culture results may not be optimal due to an excessive volume of blood received in culture bottles   Culture   Final    NO GROWTH 3 DAYS Performed at [facility], [address] [place], [place] [address]    Report Status PENDING  Incomplete  Culture, blood (Routine x 2)     Status: None (Preliminary result)   Collection Time: 11/15/17  8:11 AM  Result Value Ref Range Status   Specimen Description BLOOD L HAND  Final   Special Requests   Final  BOTTLES DRAWN AEROBIC AND ANAEROBIC Blood Culture adequate volume   Culture   Final    NO GROWTH 3 DAYS Performed at [facility], [address] [place], [place] [address]    Report Status PENDING  Incomplete  Urine culture     Status: Abnormal   Collection Time: 11/15/17  8:11 AM  Result Value Ref Range Status   Specimen Description   Final    URINE, RANDOM Performed at [facility], [address] [place], [place] [address]    Special Requests   Final    NONE Performed at [facility], [address] [place], [place] [address]    Culture >=100,000 COLONIES/mL KLEBSIELLA PNEUMONIAE (A)  Final   Report Status 11/17/2017 FINAL  Final   Organism ID, Bacteria KLEBSIELLA PNEUMONIAE (A)  Final      Susceptibility   Klebsiella pneumoniae - MIC*    AMPICILLIN >=32 RESISTANT Resistant     CEFAZOLIN <=4 SENSITIVE Sensitive     CEFTRIAXONE <=1 SENSITIVE Sensitive     CIPROFLOXACIN <=0.25 SENSITIVE Sensitive     GENTAMICIN <=1 SENSITIVE  Sensitive     IMIPENEM <=0.25 SENSITIVE Sensitive     NITROFURANTOIN 64 INTERMEDIATE Intermediate     TRIMETH/SULFA <=20 SENSITIVE Sensitive     AMPICILLIN/SULBACTAM 8 SENSITIVE Sensitive     PIP/TAZO <=4 SENSITIVE Sensitive     Extended ESBL NEGATIVE Sensitive     * >=100,000 COLONIES/mL KLEBSIELLA PNEUMONIAE    Coagulation Studies: Recent Labs    11/17/17 0515  LABPROT 20.9*  INR 1.82    Urinalysis: Recent Labs    11/16/17 1537  COLORURINE AMBER*  LABSPEC 1.010  PHURINE 6.0  GLUCOSEU 150*  HGBUR MODERATE*  BILIRUBINUR NEGATIVE  KETONESUR 5*  PROTEINUR 100*  NITRITE NEGATIVE  LEUKOCYTESUR LARGE*      Imaging: Dg Chest 2 View  Result Date: 11/17/2017 CLINICAL DATA:  Shortness of breath EXAM: CHEST - 2 VIEW COMPARISON:  11/15/2013 FINDINGS: Borderline heart size that is stable. Mitral annular calcification. Low lung volumes with interstitial coarsening. There is blunting of the lateral left costophrenic sulcus. IMPRESSION: Low volume chest with vascular congestion and possible small left effusion. Electronically Signed   By: Monte Fantasia M.D.   On: 11/17/2017 11:18   US Renal  Result Date: 11/16/2017 CLINICAL DATA:  Acute renal failure, metastatic colonic malignancy to the skeleton. EXAM: RENAL / URINARY TRACT ULTRASOUND COMPLETE COMPARISON:  Abdominal and pelvic CT scan of October 02, 2017 FINDINGS: Right Kidney: Length: 12.1 cm. The renal cortical echotexture is increased but remains lower than that of the adjacent liver. There is no hydronephrosis. No suspicious masses are observed. Left Kidney: Length: 12.0 cm. The renal cortical echotexture of the left kidney is similar to that on the right. There is no hydronephrosis. No suspicious masses are observed. Bladder: The urinary bladder is decompressed by Foley catheter. IMPRESSION: Mildly increased renal cortical echotexture likely reflects medical renal disease. There is no hydronephrosis. No parenchymal masses are  observed. The urinary bladder is decompressed by Foley catheter and cannot be adequately assessed. Electronically Signed   By: David  Martinique M.D.   On: 11/16/2017 12:46     Medications:   . sodium chloride Stopped (11/17/17 2307)  . ciprofloxacin Stopped (11/18/17 0017)   . calcium carbonate  400 mg of elemental calcium Oral TID WC  . dexamethasone  8 mg Oral Daily  . furosemide  80 mg Intravenous BID  . heparin  5,000 Units Subcutaneous Q8H  .  mouth rinse  15 mL Mouth Rinse BID  . multivitamin with minerals  1 tablet Oral Daily  . sodium bicarbonate  650 mg Oral TID   sodium chloride, docusate sodium, HYDROmorphone (DILAUDID) injection, ipratropium-albuterol, ondansetron (ZOFRAN) IV  Assessment/ Plan:  62 y.o. 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 ARF Oliguric with worsening Creatinine No recent NSAID or iv contrast exposure. Baseline creatinine normal at 0.88 on 11/09/17 Renal U/S negative Differential diagnosis includes ATN from hypotension and sepsis/UTI Dose meds for CrCl < 10 until UOP improves  Case discussed with Dr Janese Banks. Patient has poor performance status and may not tolerate chemo even prior to renal failure. Discussed case and findings with patient's husband and daughter.  Patient's creatinine continues to worsen and UOP remains poor. She has developed uremia with confusion and myoclonic activity. Discussed natural course of renal failure. Family have opted for pain control and hospice.  Nurse to notify hospice team  2.  Urinary tract infection with Klebsiella pneumoniae Currently treated with Cipro IV    LOS: [place] 10/16/[id number] [place], [place]  Note: This note was prepared with Dragon dictation. Any transcription  errors are unintentional

## 2017-11-18 NOTE — Progress Notes (Signed)
DR. Jerelyn Charles notified of critical high lab of calcium level of 6.4.

## 2017-11-18 NOTE — Progress Notes (Signed)
[place]  [phone] [phone]   Name: Theresa Brock Date: 11/18/2017 MRN: [id number]  DOB: [birth year]  Patient Care Team: Paulene Floor as PCP - General (Physician Assistant) Sindy Guadeloupe, MD as Medical Oncologist (Medical Oncology)    REASON FOR CONSULTATION: Palliative Care consult requested for this 62 y.o. 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  Patient is status post XRT to the sacrum and right femur.  She was admitted to the hospital on 11/15/2017 with sepsis with urine culture noted for Klebsiella.  Unfortunately, her hospitalization has been complicated by acute transaminitis and acute worsening renal failure.  Palliative care was consulted to help with symptom management and goals of care.  CODE STATUS: DNR  PAST MEDICAL HISTORY: Past Medical History:  Diagnosis Date  . Colon cancer ([place])   . Diplopia 07/05/2014   Overview:  Converted from Centricity: Description - DIPLOPIA  . Dysphagia, oropharyngeal phase 09/20/2013   Overview:  Converted from Centricity: Description - DYSPHAGIA, OROPHARYNGEAL PHASE  . Essential (primary) hypertension 09/20/2013   Overview:  Converted from Centricity: Description - HYPERTENSION, BENIGN ESSENTIAL  . GERD (gastroesophageal reflux disease)   . Hyperlipidemia   . Hypertension   . Malignant neoplasm of colon ([place]) 01/23/2017  . Stroke ([place])   . Tinea unguium 09/20/2013   Overview:  Converted from Centricity: Description - ONYCHOMYCOSIS, TOENAILS    PAST SURGICAL HISTORY:  Past Surgical History:  Procedure Laterality Date  . COLOSTOMY    .  FLEXIBLE SIGMOIDOSCOPY N/A 05/14/2017   Procedure: FLEXIBLE SIGMOIDOSCOPY;  Surgeon: Lin Landsman, MD;  Location: [facility] ENDOSCOPY;  Service: Gastroenterology;  Laterality: N/A;  . PORT A CATH INJECTION ([place] [facility]) Right    7/18    HEMATOLOGY/ONCOLOGY HISTORY:  Oncology History   Patient initially presented as 62 year old female with past medical history significant for hypertension, hyperlipidemia, and 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  At baseline she ambulated less than 30 feet with cane and used wheelchair for long distances.  She was diagnosed with stage IIIc adenocarcinoma of the colon in May 2018 in [place], [place].  She had presented with abdominal pain nausea vomiting and intermittent rectal bleeding in April 2018 and was found to have small bowel obstruction at that time.  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  No loss of nuclear expression of MMR proteins. Tumor was poorly differentiated and invades through the muscularis propria into pericolonic tissue. No microscopy perforation. L VIN PNI were present. Margins were negative. pT3pN2b  Patient had a complicated postoperative course following the colectomy with postoperative ileus C. difficile colitis and was admitted in the ICU for septic shock with pressors. There was a considerable delay because of that in starting adjuvant chemotherapy. CBC back in July 2018 was normal with a white count of 10.7, H&H of 12/37 and a platelet count of 445. LFTs were within  normal limits and serum creatinine was 0.6. CT chest abdomen and pelvis in July 2018 did not  reveal any evidence of recurrent or metastatic disease.  Patient was also seen by GYN for thickened endometrium and underwent transvaginal ultrasound in August 2018 which showed a 2.8 cm fibroid as well as endometrial thickening of 12.8 mm. Plan was for hysteroscopy and D&C in September 2018  Adjuvant chemotherapy with FOLFOX was planned every 2 weeks for 12 cycles which started in August 2018. Adjuvant chemotherapy was initiated on 09/30/2016 and, per Dr. Elroy Channel review of records, her last chemotherapy was cycle #5 that was given on 11/27/2016. Patient was admitted to the hospital on 12/07/2016 with renal failure and failure to thrive and did not desire further chemotherapy after that point.  Patient lives in an [place] and travels between [place] and [place].  Per patient, she returned to baseline after stopping chemotherapy in October 2018 and has been on surveillance since that time.  10/02/2017-CT imaging revealed new large 5.3 cm destructive lesion in the left sacrum consistent with metastatic colon cancer.  Lesion encroaches on the left S1 nerve root.  No other evidence of metastatic disease identified in the chest, abdomen, or pelvis.  She was experiencing pain and was evaluated by radiation oncology for palliative radiation.  Bone Scan revealed: Multiple osseous metastases including BILATERAL femora and RIGHT humerus.   11/06/2017- MRI Brain d/t increased weakness. Negative for intracranial metastasis.      Malignant neoplasm of colon ([place])   01/23/2017 Initial Diagnosis    Malignant neoplasm of colon ([place])    10/25/2017 -  Chemotherapy    The patient had palonosetron (ALOXI) injection 0.25 mg, 0.25 mg, Intravenous,  Once, 0 of 4 cycles irinotecan (CAMPTOSAR) 300 mg in dextrose 5 % 500 mL chemo infusion, 180 mg/m2, Intravenous,  Once, 0 of 4 cycles leucovorin 672 mg in dextrose 5 % 250 mL infusion, 400 mg/m2, Intravenous,  Once, 0 of 4 cycles fluorouracil (ADRUCIL) chemo injection 650  mg, 400 mg/m2, Intravenous,  Once, 0 of 4 cycles fluorouracil (ADRUCIL) 4,050 mg in sodium chloride 0.9 % 69 mL chemo infusion, 2,400 mg/m2, Intravenous, 1 Day/Dose, 0 of 4 cycles  for chemotherapy treatment.      Bone metastases ([place])   10/25/2017 -  Chemotherapy    The patient had palonosetron (ALOXI) injection 0.25 mg, 0.25 mg, Intravenous,  Once, 0 of 4 cycles irinotecan (CAMPTOSAR) 300 mg in dextrose 5 % 500 mL chemo infusion, 180 mg/m2, Intravenous,  Once, 0 of 4 cycles leucovorin 672 mg in dextrose 5 % 250 mL infusion, 400 mg/m2, Intravenous,  Once, 0 of 4 cycles fluorouracil (ADRUCIL) chemo injection 650 mg, 400 mg/m2, Intravenous,  Once, 0 of 4 cycles fluorouracil (ADRUCIL) 4,050 mg in sodium chloride 0.9 % 69 mL chemo infusion, 2,400 mg/m2, Intravenous, 1 Day/Dose, 0 of 4 cycles  for chemotherapy treatment.     10/26/2017 Initial Diagnosis    Bone metastases ([facility])     ALLERGIES:  has No Known Allergies.  MEDICATIONS:  Current Facility-Administered Medications  Medication Dose Route Frequency Provider Last Rate Last Dose  . calcium carbonate (TUMS - dosed in mg elemental calcium) chewable tablet 400 mg of elemental calcium  400 mg of elemental calcium Oral TID WC Salary, Montell D, MD   400 mg of elemental calcium at 11/17/17 1652  . dexamethasone (DECADRON) tablet 8 mg  8 mg Oral Daily Vaughan Basta, MD   8 mg at 11/17/17 1017  . docusate sodium (COLACE)  capsule 100 mg  100 mg Oral BID PRN Vaughan Basta, MD      . furosemide (LASIX) injection 80 mg  80 mg Intravenous BID Salary, Montell D, MD   80 mg at 11/18/17 0800  . HYDROmorphone (DILAUDID) injection 0.5 mg  0.5 mg Intravenous Q2H PRN Borders, Kirt Boys, NP      . ipratropium-albuterol (DUONEB) 0.5-2.5 (3) MG/3ML nebulizer solution 3 mL  3 mL Nebulization Q4H PRN Salary, Montell D, MD   3 mL at 11/17/17 1704  . MEDLINE mouth rinse  15 mL Mouth Rinse BID Vaughan Basta, MD   15 mL at 11/17/17 1027  .  ondansetron (ZOFRAN) injection 4 mg  4 mg Intravenous Q4H PRN Arta Silence, MD   4 mg at 11/16/17 2058  . sodium bicarbonate tablet 650 mg  650 mg Oral TID Loney Hering D, MD   650 mg at 11/18/17 0906    VITAL SIGNS: BP 125/82 (BP Location: Right Arm)   Pulse (!) 105   Temp 97.9 F (36.6 C) (Axillary)   Resp (!) 22   Ht 5' (1.524 m)   Wt 162 lb (73.5 kg)   SpO2 99%   BMI 31.64 kg/m  Filed Weights   11/15/17 0812 11/17/17 1222  Weight: 132 lb 4.4 oz (60 kg) 162 lb (73.5 kg)    Estimated body mass index is 31.64 kg/m as calculated from the following:   Height as of this encounter: 5' (1.524 m).   Weight as of this encounter: 162 lb (73.5 kg).  LABS: CBC:    Component Value Date/Time   WBC 19.3 (H) 11/18/2017 0604   HGB 8.5 (L) 11/18/2017 0753   HCT 26.6 (L) 11/18/2017 0753   PLT 122 (L) 11/18/2017 0604   MCV 94.7 11/18/2017 0604   NEUTROABS 14.6 (H) 11/18/2017 0604   LYMPHSABS 0.5 (L) 11/18/2017 0604   MONOABS 1.3 (H) 11/18/2017 0604   EOSABS 0.0 11/18/2017 0604   BASOSABS 0.1 11/18/2017 0604   Comprehensive Metabolic Panel:    Component Value Date/Time   NA 138 11/18/2017 0604   NA 142 06/09/2017 1546   K 4.7 11/18/2017 0604   CL 111 11/18/2017 0604   CO2 15 (L) 11/18/2017 0604   BUN 56 (H) 11/18/2017 0604   BUN 17 06/09/2017 1546   CREATININE 6.23 (H) 11/18/2017 0604   GLUCOSE 144 (H) 11/18/2017 0604   CALCIUM 6.4 (LL) 11/18/2017 0604   AST 156 (H) 11/18/2017 0604   ALT 66 (H) 11/18/2017 0604   ALKPHOS 418 (H) 11/18/2017 0604   BILITOT 1.1 11/18/2017 0604   BILITOT 0.2 06/09/2017 1546   PROT 5.5 (L) 11/18/2017 0604   PROT 7.1 06/09/2017 1546   ALBUMIN 1.8 (L) 11/18/2017 0604   ALBUMIN 4.3 06/09/2017 1546    RADIOGRAPHIC STUDIES: Dg Chest 2 View  Result Date: 11/17/2017 CLINICAL DATA:  Shortness of breath EXAM: CHEST - 2 VIEW COMPARISON:  11/15/2013 FINDINGS: Borderline heart size that is stable. Mitral annular calcification. Low lung  volumes with interstitial coarsening. There is blunting of the lateral left costophrenic sulcus. IMPRESSION: Low volume chest with vascular congestion and possible small left effusion. Electronically Signed   By: Monte Fantasia M.D.   On: 11/17/2017 11:18   Mr Jeri Cos ZO Contrast  Result Date: 11/07/2017 CLINICAL DATA:  62 y/o F; history of right-sided stroke in 2015 as well as metastatic colon cancer. Sudden onset right-sided weakness 5 days ago. EXAM: MRI HEAD WITHOUT AND WITH CONTRAST TECHNIQUE: Multiplanar, multiecho  pulse sequences of the brain and surrounding structures were obtained without and with intravenous contrast. CONTRAST:  6.5 cc Gadavist COMPARISON:  None. FINDINGS: Brain: Chronic hemosiderin stained infarction of the right insula, lentiform nucleus, and corona radiata with wallerian degeneration extending into the right cerebral peduncle and brainstem. Additional small foci of susceptibility hypointensity are present within the left temporal and occipital lobes which may represent hemosiderin deposition of chronic microhemorrhage or tiny cavernoma. Mildly increased T2 and decreased T1 signal within the left caudate head, anterior lentiform nucleus, and insula (series 15, image 14 and 16) with intermediate diffusion on ADC (series 5, image 33 and series 6, image 33). After administration of intravenous contrast there is no abnormal enhancement of the brain. No hydrocephalus, extra-axial collection, mass effect, or herniation. Scattered nonspecific T2 FLAIR hyperintensities in subcortical and periventricular white matter are compatible with moderate chronic microvascular ischemic changes for age. Moderate volume loss of the brain. Vascular: Normal flow voids. Skull and upper cervical spine: Normal marrow signal. Sinuses/Orbits: Negative. Other: None. IMPRESSION: 1. Increased signal within left caudate head, anterior lentiform nucleus, and insula with intermediate diffusion. Findings may  represent subacute infarction. Differential includes hypoxic ischemic or toxic metabolic injury. 2. No intracranial metastasis identified. 3. Chronic infarct in the right insula and basal ganglia. Moderate chronic microvascular ischemic changes and volume loss of the brain. Electronically Signed   By: Kristine Garbe M.D.   On: 11/07/2017 00:48   Nm Bone Scan Whole Body  Result Date: 11/02/2017 CLINICAL DATA:  Colon cancer, bone metastases EXAM: NUCLEAR MEDICINE WHOLE BODY BONE SCAN TECHNIQUE: Whole body anterior and posterior images were obtained approximately 3 hours after intravenous injection of radiopharmaceutical. RADIOPHARMACEUTICALS:  23.273 mCi Technetium-63mMDP IV COMPARISON:  None Correlation: CT chest abdomen pelvis 10/02/2017 FINDINGS: Multiple sites of abnormal osseous tracer accumulation are identified consistent with osseous metastatic disease. These include calvarium, proximal and mid RIGHT humerus, distal sternum, midthoracic and upper lumbar spine, posterior RIGHT rib, multiple sites in pelvis bilaterally, and BILATERAL proximal femora greater on RIGHT. Somewhat pronounced renal cortical retention of tracer bilaterally suggesting renal dysfunction. Expected soft tissue distribution of tracer. IMPRESSION: Multiple osseous metastases as above, including BILATERAL femora and RIGHT humerus as discussed above. Electronically Signed   By: MLavonia DanaM.D.   On: 11/02/2017 17:28   UKoreaRenal  Result Date: 11/16/2017 CLINICAL DATA:  Acute renal failure, metastatic colonic malignancy to the skeleton. EXAM: RENAL / URINARY TRACT ULTRASOUND COMPLETE COMPARISON:  Abdominal and pelvic CT scan of October 02, 2017 FINDINGS: Right Kidney: Length: 12.1 cm. The renal cortical echotexture is increased but remains lower than that of the adjacent liver. There is no hydronephrosis. No suspicious masses are observed. Left Kidney: Length: 12.0 cm. The renal cortical echotexture of the left kidney is  similar to that on the right. There is no hydronephrosis. No suspicious masses are observed. Bladder: The urinary bladder is decompressed by Foley catheter. IMPRESSION: Mildly increased renal cortical echotexture likely reflects medical renal disease. There is no hydronephrosis. No parenchymal masses are observed. The urinary bladder is decompressed by Foley catheter and cannot be adequately assessed. Electronically Signed   By: David  JMartiniqueM.D.   On: 11/16/2017 12:46   Dg Chest Port 1 View  Result Date: 11/15/2017 CLINICAL DATA:  Sepsis, altered mental status EXAM: PORTABLE CHEST 1 VIEW COMPARISON:  CT chest 10/02/2017 FINDINGS: Cardiac enlargement. Bilateral mild airspace disease is symmetric and could represent edema. Bibasilar atelectasis. Small left effusion. IMPRESSION: Mild bilateral airspace disease  possibly edema. Left lower lobe atelectasis/infiltrate and small left effusion. Electronically Signed   By: Franchot Gallo M.D.   On: 11/15/2017 09:22   US Abdomen Limited Ruq  Result Date: 11/15/2017 CLINICAL DATA:  Sepsis and elevated LFTs EXAM: ULTRASOUND ABDOMEN LIMITED RIGHT UPPER QUADRANT COMPARISON:  None. FINDINGS: Gallbladder: No gallstones or wall thickening visualized. No sonographic Murphy sign noted by sonographer. Common bile duct: Diameter: 3.8 mm Liver: Diffuse increased echogenicity is noted consistent with fatty infiltration. No focal mass is noted. Portal vein is patent on color Doppler imaging with normal direction of blood flow towards the liver. IMPRESSION: Fatty liver Electronically Signed   By: Inez Catalina M.D.   On: 11/15/2017 12:17    PERFORMANCE STATUS (ECOG) : 4 - Bedbound  Review of Systems As noted above. Otherwise, a complete review of systems is negative.  Physical Exam General: Appearing Cardiovascular: regular rate and rhythm Pulmonary: Unlabored, on O2 Extremities: no edema Skin: no rashes Neurological: Weakness, confusion  IMPRESSION: Patient serum  creatinine continues to worsen, which is up to 6.23 today from 5.38 yesterday.  Urine output is minimal.  Confusion is worse today likely in the setting of uremia.  Family spoke this morning with Dr. Candiss Norse.  I also met patient's husband and daughter.  Together we reviewed patient's current medical problems.  They were tearful but expressed that they do not think that patient would want hemodialysis.  They also feel that it is unlikely that even with continued aggressive treatment that patient would be able to return to her quality of life that she would find meaningful (e.g.  Going to grandkids sports).  Both husband and daughter expressed understanding the patient is likely approaching end-of-life and verbalized a desire to transition care to focus on her comfort.  We discussed comfort measures in detail including stopping antibiotics, labs, and other medications not geared towards comfort.  Family states they are in agreement with this plan.  We also discussed CODE STATUS.  Husband and daughter now states that they would not want patient be resuscitated or her life prolonged artificially.  They are in agreement with DNR.  We discussed the option of transition to residential hospice.  Family are familiar with the hospice home in [place] and would like to pursue transfer they are if possible.  Will consult social work to help coordinate transfer.  Attending physician updated.  PLAN: Comfort care DNR Referral to residential hospice  Time Total: 45 minutes  Visit consisted of counseling and education dealing with the complex and emotionally intense issues of symptom management and palliative care in the setting of serious and potentially life-threatening illness.Greater than 50%  of this time was spent counseling and coordinating care related to the above assessment and plan.  Signed by: Altha Harm, [place], NP-C, [place] (Work Cell)

## 2017-11-18 NOTE — Clinical Social Work Note (Signed)
CSW informed by Palliative Care, Merrily Pew, that patient and family wish to pursue hospice home locally. CSW notified Santiago Glad with [facility] of referral. Santiago Glad spoke with family and made arrangements for transfer to the hospice home. Shela Leff MSW,LCSW [phone]

## 2017-11-18 NOTE — Discharge Summary (Addendum)
[facility] [place] NAME: Theresa Brock    MR#:  [id number]  DATE OF BIRTH:  1955/04/02  DATE OF ADMISSION:  11/15/2017 ADMITTING PHYSICIAN: Vaughan Basta, MD  DATE OF DISCHARGE: No discharge date for patient encounter.  PRIMARY CARE PHYSICIAN: Trinna Post, PA-C    ADMISSION DIAGNOSIS:  Urinary tract infection without hematuria, site unspecified [N39.0] Community acquired pneumonia, unspecified laterality [J18.9] Sepsis, due to unspecified organism, unspecified whether acute organ dysfunction present ([place]) [A41.9]  DISCHARGE DIAGNOSIS:  Principal Problem:   Severe sepsis ([place]) Active Problems:   Acute lower UTI   Acute renal failure (ARF) (HCC)   Elevated LFTs   Palliative care encounter   Pressure injury of skin   SECONDARY DIAGNOSIS:   Past Medical History:  Diagnosis Date  . Colon cancer ([place])   . Diplopia 07/05/2014   Overview:  Converted from [facility]: Description - DIPLOPIA  . Dysphagia, oropharyngeal phase 09/20/2013   Overview:  Converted from [facility]: Description - DYSPHAGIA, OROPHARYNGEAL PHASE  . Essential (primary) hypertension 09/20/2013   Overview:  Converted from [facility]: Description - HYPERTENSION, BENIGN ESSENTIAL  . GERD (gastroesophageal reflux disease)   . Hyperlipidemia   . Hypertension   . Malignant neoplasm of colon ([place]) 01/23/2017  . Stroke ([place])   . Tinea unguium 09/20/2013   Overview:  Converted from Cent[facility]: Description - ONYCHOMYCOSIS, TOENAILS    HOSPITAL COURSE:  *Acute severe sepsis Treated on our sepsis protocol, urine culture noted for Klebsiella-change antibiotics to Cipro IV, given patient's terminal condition and acute renal failure in need of dialysis-the patient/patient's husband/patient's daughter agreed for hospice going forward with expectant management, patient placed on comfort care measures with plans for hospice haven given terminal condition     *Acute worsening renal failure with anuria  Nephrology did see patient while in house  renal ultrasound negative Plan of care as stated above  *Acute transaminitis with jaundice appearance Plan of care as stated above  *Hyperlipidemia Plan of care as stated above  *Acute hypotension with history of hypertension  Plan of care as stated above  *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  Plan of care as stated above  *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 with associated cancer pain Oncology did see patient while in house Plan of care as stated above  *History of stroke with left-sided hemiplegia Plan of care as stated above  Terminal condition, for hospice haven  DISCHARGE CONDITIONS:   Terminal  CONSULTS OBTAINED:  Treatment Team:  Sindy Guadeloupe, MD Murlean Iba, MD Lucilla Lame, MD  DRUG ALLERGIES:  No Known Allergies  DISCHARGE MEDICATIONS:   Allergies as of 11/18/2017   No Known Allergies     Medication List    STOP taking these medications   atorvastatin 20 MG tablet Commonly known as:  LIPITOR   fentaNYL 12 MCG/HR Commonly known as:  DURAGESIC - dosed mcg/hr   loperamide 2 MG tablet Commonly known as:  IMODIUM A-D   losartan 25 MG tablet Commonly known as:  COZAAR   multivitamin with minerals Tabs tablet   ondansetron 8 MG tablet Commonly known as:  ZOFRAN   prochlorperazine 10 MG tablet Commonly known as:  COMPAZINE     TAKE these medications   acetaminophen 325 MG tablet Commonly known as:  TYLENOL Take 650 mg by mouth every 6 (six) hours as needed.   dexamethasone 4 MG tablet Commonly known as:  DECADRON Take 2 tablets (8  mg total) by mouth daily. With food   docusate sodium 250 MG capsule Commonly known as:  COLACE Take 1 capsule (250 mg total) by mouth daily.   LORazepam 0.5 MG tablet Commonly known as:  ATIVAN Take 1 tablet (0.5 mg total) by mouth every 2 (two) hours as needed for anxiety, seizure,  sedation or sleep. What changed:    medication strength  how much to take  when to take this  reasons to take this   morphine 30 MG tablet Commonly known as:  MSIR Take 1 tablet (30 mg total) by mouth every 4 (four) hours as needed for severe pain. What changed:  additional instructions   morphine 15 MG 12 hr tablet Commonly known as:  MS CONTIN Take 1 tablet (15 mg total) by mouth every 12 (twelve) hours for 7 days. What changed:  Another medication with the same name was changed. Make sure you understand how and when to take each.   OLANZapine 5 MG tablet Commonly known as:  ZYPREXA Take 1 tablet (5 mg total) by mouth at bedtime.        DISCHARGE INSTRUCTIONS:  If you experience worsening of your admission symptoms, develop shortness of breath, life threatening emergency, suicidal or homicidal thoughts you must seek medical attention immediately by calling 911 or calling your MD immediately  if symptoms less severe.  You Must read complete instructions/literature along with all the possible adverse reactions/side effects for all the Medicines you take and that have been prescribed to you. Take any new Medicines after you have completely understood and accept all the possible adverse reactions/side effects.   Please note  You were cared for by a hospitalist during your hospital stay. If you have any questions about your discharge medications or the care you received while you were in the hospital after you are discharged, you can call the unit and asked to speak with the hospitalist on call if the hospitalist that took care of you is not available. Once you are discharged, your primary care physician will handle any further medical issues. Please note that NO REFILLS for any discharge medications will be authorized once you are discharged, as it is imperative that you return to your primary care physician (or establish a relationship with a primary care physician if you do not  have one) for your aftercare needs so that they can reassess your need for medications and monitor your lab values.    Today   CHIEF COMPLAINT:   Chief Complaint  Patient presents with  . Code Sepsis    HISTORY OF PRESENT ILLNESS:  62 y.o. 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  Was able to walk with a cane until a month ago.  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  Currently bedbound or wheelchair-bound for the last few weeks.  Noted to have spread of cancer to her bones on recent nuclear scan.  Started on radiation therapy and also on pain management with increased dose of oral morphine recently. Since yesterday she is more drowsy and sleepy at home and not eating much, so husband brought her to the emergency room today. She is noted to be having acute renal failure,  hypotension, fever, leukocytosis, elevated bilirubin and liver function enzymes, lactic acidosis, tachycardia. ER physician give broad-spectrum antibiotic and IV fluids and found to have UTI.  Given to hospitalist team for further management.  VITAL SIGNS:  Blood pressure 125/82, pulse (!) 105, temperature 97.9 F (36.6 C), temperature source Axillary, resp. rate (!) 22, height 5' (1.524 m), weight 73.5 kg, SpO2 99 %.  I/O:    Intake/Output Summary (Last 24 hours) at 11/18/2017 1329 Last data filed at 11/18/2017 2330 Gross per 24 hour  Intake 327.55 ml  Output 200 ml  Net 127.55 ml    PHYSICAL EXAMINATION:  GENERAL:  62 y.o.-year-old patient lying in the bed with no acute distress.  EYES: Pupils equal, round, reactive to light and accommodation. No scleral  icterus. Extraocular muscles intact.  HEENT: Head atraumatic, normocephalic. Oropharynx and nasopharynx clear.  NECK:  Supple, no jugular venous distention. No thyroid enlargement, no tenderness.  LUNGS: Normal breath sounds bilaterally, no wheezing, rales,rhonchi or crepitation. No use of accessory muscles of respiration.  CARDIOVASCULAR: S1, S2 normal. No murmurs, rubs, or gallops.  ABDOMEN: Soft, non-tender, non-distended. Bowel sounds present. No organomegaly or mass.  EXTREMITIES: No pedal edema, cyanosis, or clubbing.  NEUROLOGIC: Cranial nerves II through XII are intact. Muscle strength 5/5 in all extremities. Sensation intact. Gait not checked.  PSYCHIATRIC: The patient is alert and oriented x 3.  SKIN: No obvious rash, lesion, or ulcer.   DATA REVIEW:   CBC Recent Labs  Lab 11/18/17 0604 11/18/17 0753  WBC 19.3*  --   HGB 7.9* 8.5*  HCT 24.9* 26.6*  PLT 122*  --     Chemistries  Recent Labs  Lab 11/18/17 0604  NA 138  K 4.7  CL 111  CO2 15*  GLUCOSE 144*  BUN 56*  CREATININE 6.23*  CALCIUM 6.4*  AST 156*  ALT 66*  ALKPHOS 418*  BILITOT 1.1    Cardiac Enzymes No results for input(s): TROPONINI in the last 168 hours.  Microbiology Results  Results for orders placed or performed during the hospital encounter of 11/15/17  Culture, blood (Routine x 2)     Status: None (Preliminary result)   Collection Time: 11/15/17  8:11 AM  Result Value Ref Range Status   Specimen Description BLOOD LAC  Final   Special Requests   Final    BOTTLES DRAWN AEROBIC AND ANAEROBIC Blood Culture results may not be optimal due to an excessive volume of blood received in culture bottles   Culture   Final    NO GROWTH 3 DAYS Performed at [facility], [address], [place], [place] [address]    Report Status PENDING  Incomplete  Culture, blood (Routine x 2)     Status: None (Preliminary result)   Collection Time: 11/15/17  8:11 AM  Result Value Ref Range Status    Specimen Description BLOOD L HAND  Final   Special Requests   Final    BOTTLES DRAWN AEROBIC AND ANAEROBIC Blood Culture adequate volume   Culture   Final    NO GROWTH 3 DAYS Performed at [facility], [address], [place], [place] [address]    Report Status PENDING  Incomplete  Urine culture     Status: Abnormal   Collection Time: 11/15/17  8:11 AM  Result Value Ref Range Status   Specimen Description   Final    URINE, RANDOM Performed at [facility], [address] [place],  [address]    Special  Requests   Final    NONE Performed at [facility], [place], [place] [address]    Culture >=100,000 COLONIES/mL KLEBSIELLA PNEUMONIAE (A)  Final   Report Status 11/17/2017 FINAL  Final   Organism ID, Bacteria KLEBSIELLA PNEUMONIAE (A)  Final      Susceptibility   Klebsiella pneumoniae - MIC*    AMPICILLIN >=32 RESISTANT Resistant     CEFAZOLIN <=4 SENSITIVE Sensitive     CEFTRIAXONE <=1 SENSITIVE Sensitive     CIPROFLOXACIN <=0.25 SENSITIVE Sensitive     GENTAMICIN <=1 SENSITIVE Sensitive     IMIPENEM <=0.25 SENSITIVE Sensitive     NITROFURANTOIN 64 INTERMEDIATE Intermediate     TRIMETH/SULFA <=20 SENSITIVE Sensitive     AMPICILLIN/SULBACTAM 8 SENSITIVE Sensitive     PIP/TAZO <=4 SENSITIVE Sensitive     Extended ESBL NEGATIVE Sensitive     * >=100,000 COLONIES/mL KLEBSIELLA PNEUMONIAE    RADIOLOGY:  Dg Chest 2 View  Result Date: 11/17/2017 CLINICAL DATA:  Shortness of breath EXAM: CHEST - 2 VIEW COMPARISON:  11/15/2013 FINDINGS: Borderline heart size that is stable. Mitral annular calcification. Low lung volumes with interstitial coarsening. There is blunting of the lateral left costophrenic sulcus. IMPRESSION: Low volume chest with vascular congestion and possible small left effusion. Electronically Signed   By: Monte Fantasia M.D.   On: 11/17/2017 11:18    EKG:   Orders placed or performed during the hospital  encounter of 11/15/17  . ED EKG 12-Lead  . ED EKG 12-Lead  . ED EKG  . ED EKG      Management plans discussed with the patient, family and they are in agreement.  CODE STATUS:     Code Status Orders  (From admission, onward)         Start     Ordered   11/18/17 1016  Do not attempt resuscitation (DNR)  Continuous    Question Answer Comment  In the event of cardiac or respiratory ARREST Do not call a "code blue"   In the event of cardiac or respiratory ARREST Do not perform Intubation, CPR, defibrillation or ACLS   In the event of cardiac or respiratory ARREST Use medication by any route, position, wound care, and other measures to relive pain and suffering. May use oxygen, suction and manual treatment of airway obstruction as needed for comfort.      11/18/17 1017        Code Status History    Date Active Date Inactive Code Status Order ID Comments User Context   11/15/2017 1722 11/18/2017 1017 Partial Code [id number]  Vaughan Basta, MD Inpatient   11/15/2017 1605 11/15/2017 1722 DNR [id number]  Vaughan Basta, MD Inpatient   11/15/2017 1401 11/15/2017 1605 DNR [id number]  Vaughan Basta, MD ED    Advance Directive Documentation     Most Recent Value  Type of Advance Directive  Healthcare Power of [place], Living will  Pre-existing out of facility DNR order (yellow form or pink MOST form)  -  "MOST" Form in Place?  -      TOTAL TIME TAKING CARE OF THIS PATIENT: 40 minutes.    Avel Peace Dickey Caamano M.D on 11/18/2017 at 1:29 PM  Between 7am to 6pm - Pager - [phone]  After 6pm go to [email or web address] - password [email or web address] [facility]  Office  [phone]  CC: Primary care physician; Trinna Post, PA-C   Note: This dictation was prepared with Dragon dictation along with smaller phrase  technology. Any transcriptional errors that result from this process are unintentional.

## 2017-11-19 ENCOUNTER — Inpatient Hospital Stay: Payer: BLUE CROSS/BLUE SHIELD | Admitting: Oncology

## 2017-11-19 ENCOUNTER — Ambulatory Visit: Payer: BLUE CROSS/BLUE SHIELD | Admitting: Physical Therapy

## 2017-11-19 ENCOUNTER — Inpatient Hospital Stay: Payer: BLUE CROSS/BLUE SHIELD

## 2017-11-19 ENCOUNTER — Ambulatory Visit: Payer: BLUE CROSS/BLUE SHIELD

## 2017-11-20 ENCOUNTER — Ambulatory Visit: Payer: BLUE CROSS/BLUE SHIELD

## 2017-11-20 LAB — CULTURE, BLOOD (ROUTINE X 2)
CULTURE: NO GROWTH
Culture: NO GROWTH
SPECIAL REQUESTS: ADEQUATE

## 2017-11-23 ENCOUNTER — Ambulatory Visit: Payer: BLUE CROSS/BLUE SHIELD

## 2017-11-24 ENCOUNTER — Ambulatory Visit: Payer: BLUE CROSS/BLUE SHIELD | Admitting: Physical Therapy

## 2017-11-26 ENCOUNTER — Ambulatory Visit: Payer: BLUE CROSS/BLUE SHIELD | Admitting: Physical Therapy

## 2017-12-04 DEATH — deceased

## 2017-12-09 ENCOUNTER — Ambulatory Visit: Payer: BLUE CROSS/BLUE SHIELD | Admitting: Radiation Oncology

## 2017-12-15 ENCOUNTER — Ambulatory Visit: Payer: BLUE CROSS/BLUE SHIELD | Admitting: Physician Assistant

## 2019-12-09 IMAGING — MR MR HEAD WO/W CM
14 series · 45 of 48 positions shown · IV contrast (gadavist)
Comparison: None.

CLINICAL DATA: 62 y/o F; history of right-sided stroke in 5450 as
well as metastatic colon cancer. Sudden onset right-sided weakness 5
days ago.

EXAM:
MRI HEAD WITHOUT AND WITH CONTRAST
TECHNIQUE: Multiplanar, multiecho pulse sequences of the brain and surrounding
structures were obtained without and with intravenous contrast.
CONTRAST:  6.5 cc Gadavist

[Series 5: ax dwi_tracew · axial · 3.0mm · 0.73mm/px · z∈[-79,+82]mm · 3 of 55 slices shown]
[im 1/55]
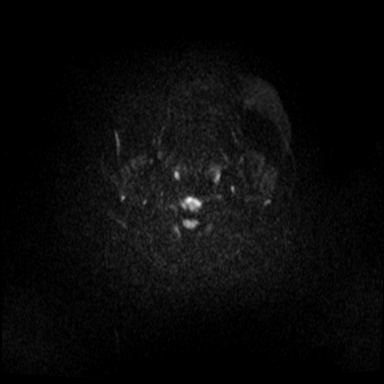
[im 28/55]
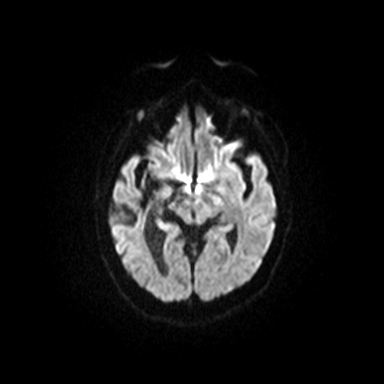
[im 55/55]
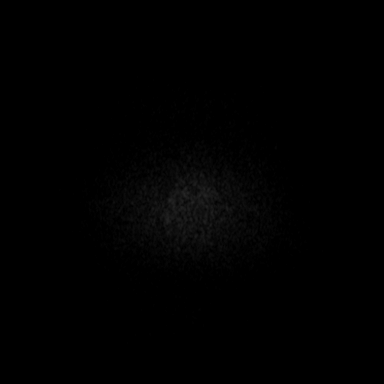

[Series 6: ax dwi_adc · axial · 3.0mm · 0.73mm/px · z∈[-79,+82]mm · 3 of 55 slices shown]
[im 1/55]
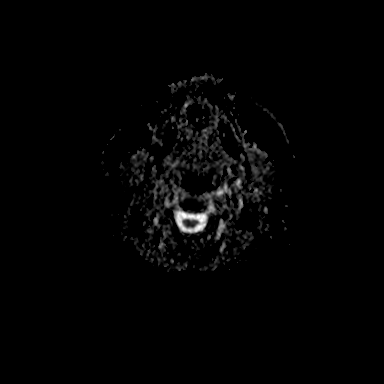
[im 28/55]
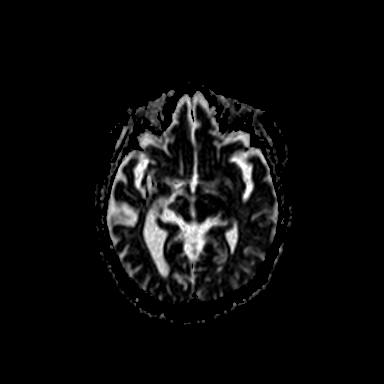
[im 55/55]
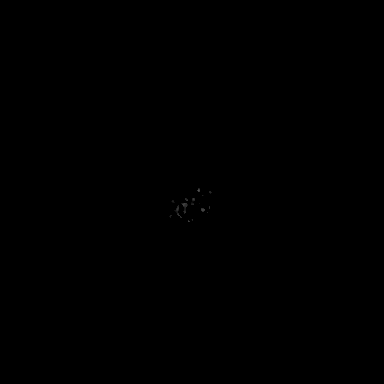

[Series 7: cor dwi_tracew · coronal · 5.0mm · 0.60mm/px · 2 of 40 slices shown]
[im 1/40]
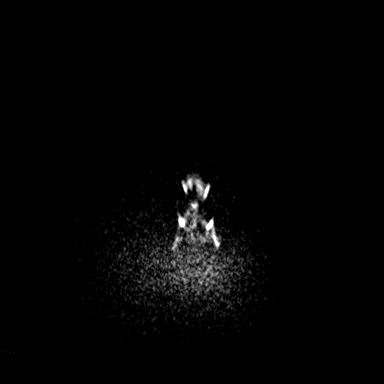
[im 40/40]
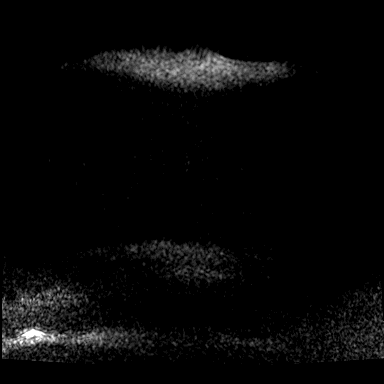

[Series 8: cor dwi_adc · coronal · 5.0mm · 0.60mm/px · 2 of 40 slices shown]
[im 1/40]
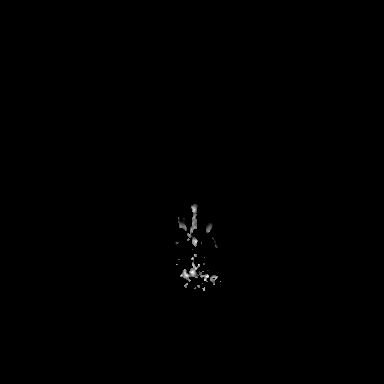
[im 40/40]
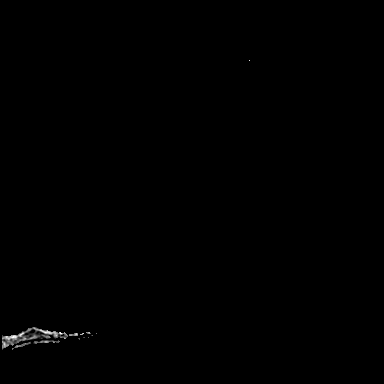

[Series 9: T1 · sagittal · 5.0mm · 0.62mm/px · 1 of 21 slices shown (1 of 2)]
[im 1/21]
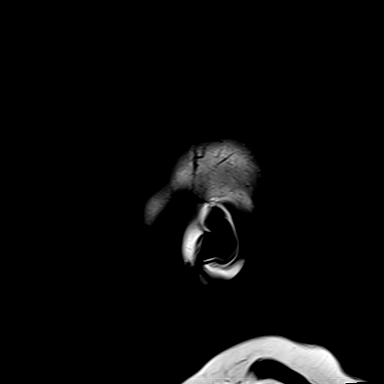

[Series 10: T2 · axial · 5.0mm · 0.53mm/px · z∈[-68,+75]mm · 2 of 25 slices shown]
[im 1/25]
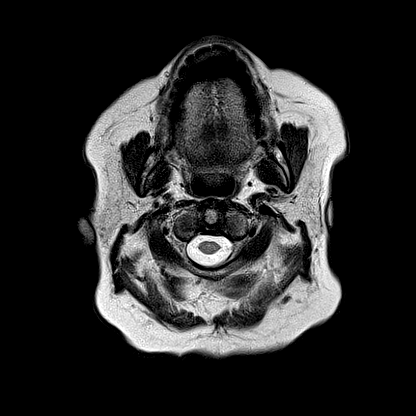
[im 25/25]
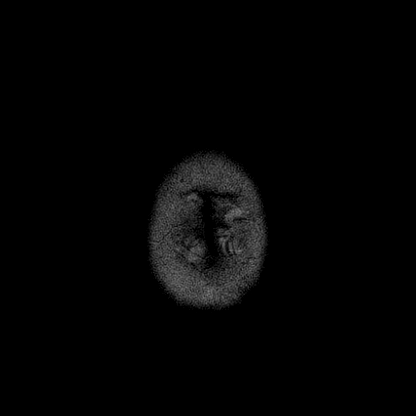

[Series 11: swi_images · axial · 3.0mm · 0.90mm/px · z∈[-84,+92]mm · 4 of 60 slices shown]
[im 1/60]
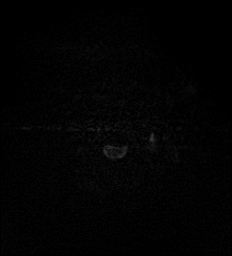
[im 20/60]
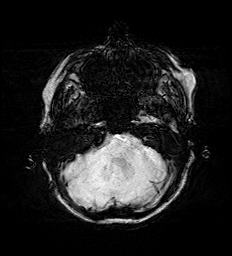
[im 40/60]
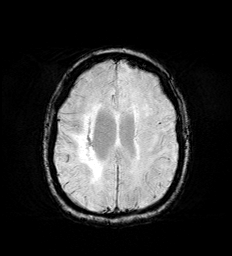
[im 60/60]
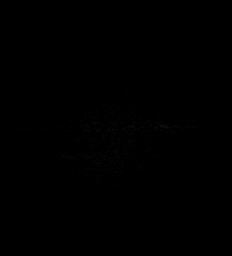

[Series 12: mip_images(sw) · axial · 24.0mm · 0.90mm/px · z∈[-73,+82]mm · 3 of 53 slices shown]
[im 1/53]
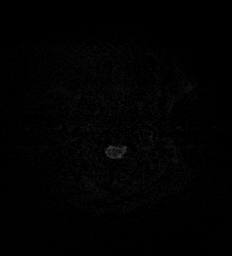
[im 27/53]
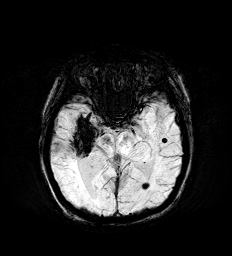
[im 53/53]
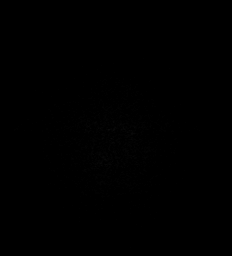

[Series 13: FLAIR · axial · 3.0mm · 0.53mm/px · z∈[-77,+84]mm · 3 of 55 slices shown]
[im 1/55]
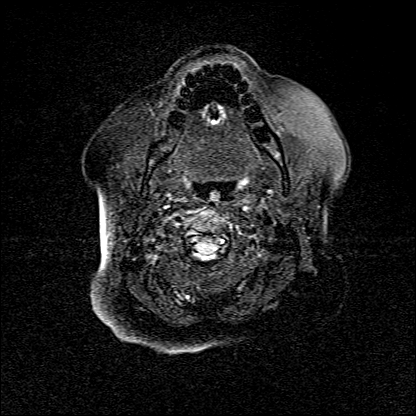
[im 28/55]
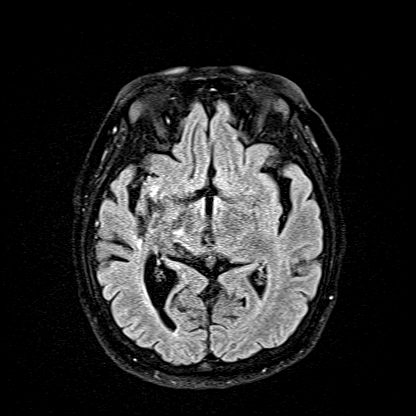
[im 55/55]
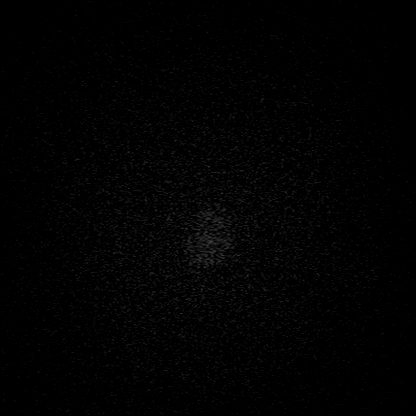

[Series 14: T1 · axial · 1.0mm · 0.98mm/px · z∈[-70,+88]mm · 8 of 160 slices shown (2 of 2)]
[im 1/160]
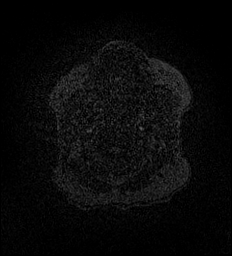
[im 18/160]
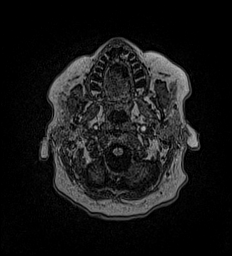
[im 54/160]
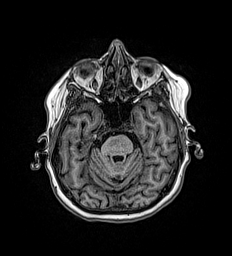
[im 71/160]
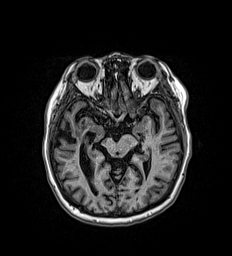
[im 89/160]
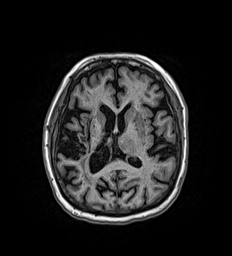
[im 107/160]
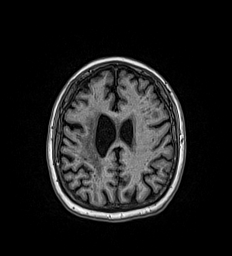
[im 142/160]
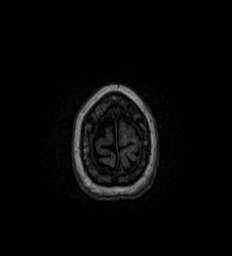
[im 160/160]
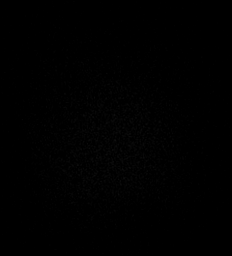

[Series 15: T2 post-contrast · coronal · 5.0mm · 0.57mm/px · 2 of 27 slices shown]
[im 1/27]
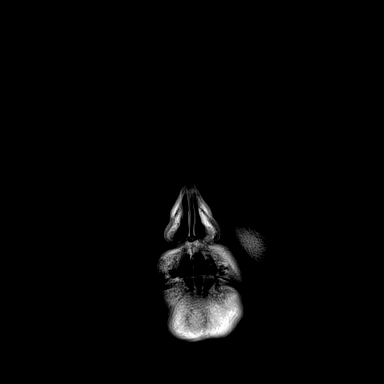
[im 27/27]
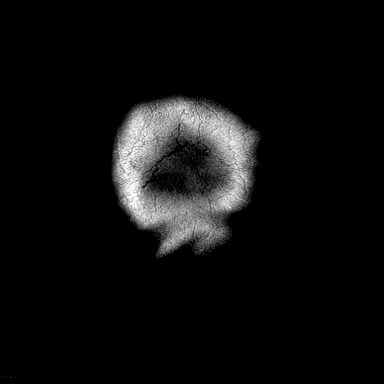

[Series 16: T1 post-contrast · axial · 1.0mm · 0.98mm/px · z∈[-70,+88]mm · 9 of 160 slices shown (1 of 3)]
[im 1/160]
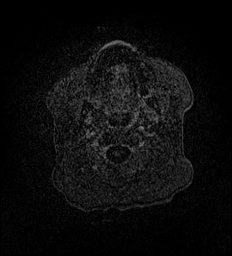
[im 18/160]
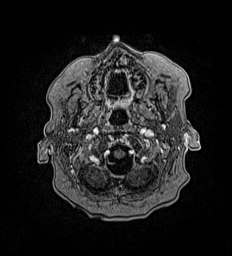
[im 36/160]
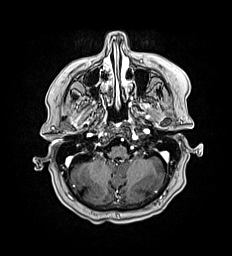
[im 54/160]
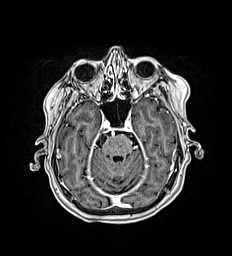
[im 71/160]
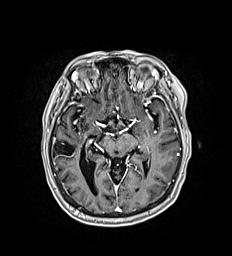
[im 89/160]
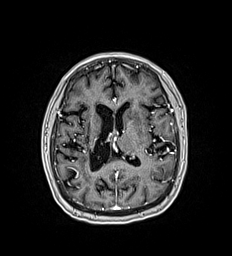
[im 107/160]
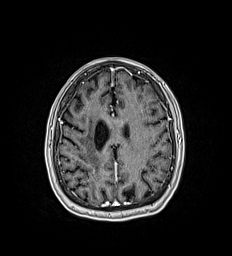
[im 142/160]
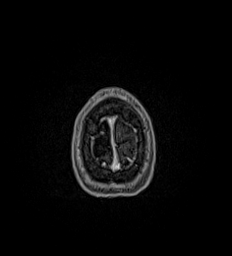
[im 160/160]
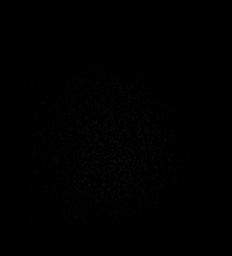

[Series 17: T1 post-contrast · coronal · 5.0mm · 0.57mm/px · 2 of 27 slices shown (2 of 3)]
[im 1/27]
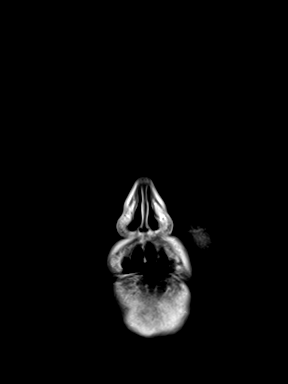
[im 27/27]
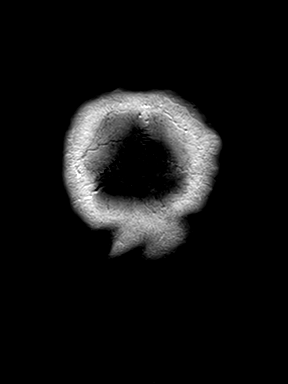

[Series 18: T1 post-contrast · sagittal · 5.0mm · 0.62mm/px · 1 of 21 slices shown (3 of 3)]
[im 1/21]
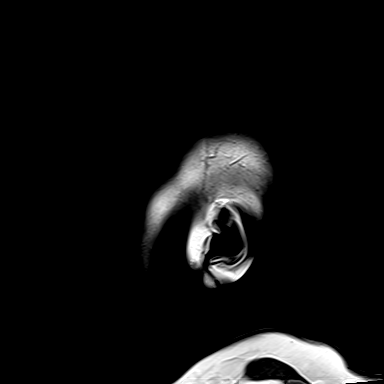

[45 of 48 positions shown; findings below may reference images not displayed]

FINDINGS: Brain: Chronic hemosiderin stained infarction of the right insula,
lentiform nucleus, and corona radiata with wallerian degeneration
extending into the right cerebral peduncle and brainstem. Additional
small foci of susceptibility hypointensity are present within the
left temporal and occipital lobes which may represent hemosiderin
deposition of chronic microhemorrhage or tiny cavernoma.

Mildly increased T2 and decreased T1 signal within the left caudate
head, anterior lentiform nucleus, and insula (series 15, image 14
and 16) with intermediate diffusion on ADC (series 5, image 33 and
series 6, image 33). After administration of intravenous contrast
there is no abnormal enhancement of the brain. No hydrocephalus,
extra-axial collection, mass effect, or herniation.

Scattered nonspecific T2 FLAIR hyperintensities in subcortical and
periventricular white matter are compatible with moderate chronic
microvascular ischemic changes for age. Moderate volume loss of the
brain.

Vascular: Normal flow voids.

Skull and upper cervical spine: Normal marrow signal.

Sinuses/Orbits: Negative.

Other: None.
IMPRESSION: 1. Increased signal within left caudate head, anterior lentiform
nucleus, and insula with intermediate diffusion. Findings may
represent subacute infarction. Differential includes hypoxic
ischemic or toxic metabolic injury.
2. No intracranial metastasis identified.
3. Chronic infarct in the right insula and basal ganglia. Moderate
chronic microvascular ischemic changes and volume loss of the brain.

By: Lirio Vh M.D.
